# Patient Record
Sex: Female | Born: 1990 | Race: Black or African American | Hispanic: No
Health system: Southern US, Community
[De-identification: ages and names within clinical notes are randomized; demographics above are authoritative.]

## PROBLEM LIST (undated history)

## (undated) DIAGNOSIS — F419 Anxiety disorder, unspecified: Secondary | ICD-10-CM

## (undated) DIAGNOSIS — F32A Depression, unspecified: Secondary | ICD-10-CM

## (undated) DIAGNOSIS — F329 Major depressive disorder, single episode, unspecified: Secondary | ICD-10-CM

## (undated) DIAGNOSIS — Z9109 Other allergy status, other than to drugs and biological substances: Secondary | ICD-10-CM

## (undated) DIAGNOSIS — G43909 Migraine, unspecified, not intractable, without status migrainosus: Secondary | ICD-10-CM

---

## 2009-02-11 ENCOUNTER — Emergency Department (HOSPITAL_COMMUNITY): Admission: EM | Admit: 2009-02-11 | Discharge: 2009-02-11 | Payer: Self-pay | Admitting: Emergency Medicine

## 2010-11-01 ENCOUNTER — Emergency Department (HOSPITAL_COMMUNITY)
Admission: EM | Admit: 2010-11-01 | Discharge: 2010-11-01 | Disposition: A | Discharge status: Home / Self Care | Attending: Emergency Medicine | Admitting: Emergency Medicine

## 2010-11-01 DIAGNOSIS — R10819 Abdominal tenderness, unspecified site: Secondary | ICD-10-CM | POA: Insufficient documentation

## 2010-11-01 DIAGNOSIS — R319 Hematuria, unspecified: Secondary | ICD-10-CM | POA: Insufficient documentation

## 2010-11-01 DIAGNOSIS — R300 Dysuria: Secondary | ICD-10-CM | POA: Insufficient documentation

## 2010-11-01 LAB — URINE MICROSCOPIC-ADD ON

## 2010-11-01 LAB — URINALYSIS, ROUTINE W REFLEX MICROSCOPIC
Bilirubin Urine: NEGATIVE
Glucose, UA: NEGATIVE mg/dL
Hgb urine dipstick: NEGATIVE
Ketones, ur: NEGATIVE mg/dL
Nitrite: NEGATIVE
Protein, ur: NEGATIVE mg/dL
Specific Gravity, Urine: 1.011 (ref 1.005–1.030)
Urobilinogen, UA: 0.2 mg/dL (ref 0.0–1.0)
pH: 7 (ref 5.0–8.0)

## 2010-11-01 LAB — POCT PREGNANCY, URINE: Preg Test, Ur: NEGATIVE

## 2010-11-02 LAB — URINE CULTURE
Colony Count: NO GROWTH
Culture  Setup Time: 201203300144
Culture: NO GROWTH

## 2010-11-11 LAB — URINALYSIS, ROUTINE W REFLEX MICROSCOPIC
Bilirubin Urine: NEGATIVE
Protein, ur: NEGATIVE mg/dL
Urobilinogen, UA: 0.2 mg/dL (ref 0.0–1.0)

## 2010-11-11 LAB — URINE CULTURE: Colony Count: 25000

## 2010-11-11 LAB — URINE MICROSCOPIC-ADD ON

## 2010-11-11 LAB — POCT PREGNANCY, URINE: Preg Test, Ur: NEGATIVE

## 2011-07-03 ENCOUNTER — Emergency Department (INDEPENDENT_AMBULATORY_CARE_PROVIDER_SITE_OTHER)

## 2011-07-03 ENCOUNTER — Emergency Department (HOSPITAL_BASED_OUTPATIENT_CLINIC_OR_DEPARTMENT_OTHER)
Admission: EM | Admit: 2011-07-03 | Discharge: 2011-07-03 | Disposition: A | Discharge status: Home / Self Care | Attending: Emergency Medicine | Admitting: Emergency Medicine

## 2011-07-03 DIAGNOSIS — M549 Dorsalgia, unspecified: Secondary | ICD-10-CM

## 2011-07-03 DIAGNOSIS — Z79899 Other long term (current) drug therapy: Secondary | ICD-10-CM | POA: Insufficient documentation

## 2011-07-03 DIAGNOSIS — M542 Cervicalgia: Secondary | ICD-10-CM

## 2011-07-03 DIAGNOSIS — Y92410 Unspecified street and highway as the place of occurrence of the external cause: Secondary | ICD-10-CM | POA: Insufficient documentation

## 2011-07-03 DIAGNOSIS — Q765 Cervical rib: Secondary | ICD-10-CM

## 2011-07-03 DIAGNOSIS — S239XXA Sprain of unspecified parts of thorax, initial encounter: Secondary | ICD-10-CM

## 2011-07-03 HISTORY — DX: Anxiety disorder, unspecified: F41.9

## 2011-07-03 HISTORY — DX: Other allergy status, other than to drugs and biological substances: Z91.09

## 2011-07-03 HISTORY — DX: Migraine, unspecified, not intractable, without status migrainosus: G43.909

## 2011-07-03 MED ORDER — ONDANSETRON 4 MG PO TBDP
ORAL_TABLET | ORAL | Status: AC
  Administered 2011-07-03: 4 mg via ORAL
  Filled 2011-07-03: qty 1

## 2011-07-03 MED ORDER — HYDROCODONE-ACETAMINOPHEN 5-325 MG PO TABS: 2.0000 | ORAL_TABLET | ORAL | Status: AC | PRN

## 2011-07-03 MED ORDER — IBUPROFEN 800 MG PO TABS: 800.0000 mg | ORAL_TABLET | Freq: Three times a day (TID) | ORAL | Status: AC

## 2011-07-03 MED ORDER — ONDANSETRON 4 MG PO TBDP
4.0000 mg | ORAL_TABLET | Freq: Once | ORAL | Status: AC
  Administered 2011-07-03: 4 mg via ORAL

## 2011-07-03 NOTE — ED Notes (Signed)
Pt states that she is now feeling nauseous, will give zofran per protocol

## 2011-07-03 NOTE — ED Notes (Signed)
Provider at bedside to remove LSB, orders given for C-Spine and T-Spine Studies
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
## 2011-07-16 ENCOUNTER — Ambulatory Visit: Payer: No Typology Code available for payment source | Admitting: Family Medicine
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
## 2012-10-21 ENCOUNTER — Other Ambulatory Visit: Payer: Self-pay | Admitting: Physician Assistant

## 2012-10-21 ENCOUNTER — Other Ambulatory Visit (HOSPITAL_COMMUNITY)
Admission: RE | Admit: 2012-10-21 | Discharge: 2012-10-21 | Disposition: A | Discharge status: Home / Self Care | Payer: BC Managed Care – PPO | Source: Ambulatory Visit | Attending: Family Medicine | Admitting: Family Medicine

## 2012-10-21 DIAGNOSIS — Z01419 Encounter for gynecological examination (general) (routine) without abnormal findings: Secondary | ICD-10-CM | POA: Insufficient documentation

## 2012-10-21 DIAGNOSIS — Z113 Encounter for screening for infections with a predominantly sexual mode of transmission: Secondary | ICD-10-CM | POA: Insufficient documentation

## 2012-11-12 ENCOUNTER — Ambulatory Visit: Payer: BC Managed Care – PPO | Admitting: Diagnostic Neuroimaging

## 2013-05-11 ENCOUNTER — Ambulatory Visit (HOSPITAL_COMMUNITY)
Admission: RE | Admit: 2013-05-11 | Discharge: 2013-05-11 | Disposition: A | Discharge status: Home / Self Care | Payer: BC Managed Care – PPO | Attending: Psychiatry | Admitting: Psychiatry

## 2013-05-11 HISTORY — DX: Depression, unspecified: F32.A

## 2013-05-11 HISTORY — DX: Major depressive disorder, single episode, unspecified: F32.9

## 2013-05-12 ENCOUNTER — Other Ambulatory Visit: Payer: Self-pay | Admitting: Emergency Medicine

## 2013-05-12 ENCOUNTER — Encounter (HOSPITAL_COMMUNITY): Payer: Self-pay | Admitting: Registered Nurse

## 2013-05-12 ENCOUNTER — Encounter (HOSPITAL_COMMUNITY): Payer: Self-pay | Admitting: *Deleted

## 2013-05-12 ENCOUNTER — Emergency Department (HOSPITAL_COMMUNITY)
Admission: EM | Admit: 2013-05-12 | Discharge: 2013-05-13 | Disposition: A | Discharge status: Other Acute Inpatient Hospital | Payer: BC Managed Care – PPO | Attending: Emergency Medicine | Admitting: Emergency Medicine

## 2013-05-12 DIAGNOSIS — Z3202 Encounter for pregnancy test, result negative: Secondary | ICD-10-CM | POA: Insufficient documentation

## 2013-05-12 DIAGNOSIS — Z79899 Other long term (current) drug therapy: Secondary | ICD-10-CM | POA: Insufficient documentation

## 2013-05-12 DIAGNOSIS — F329 Major depressive disorder, single episode, unspecified: Secondary | ICD-10-CM

## 2013-05-12 DIAGNOSIS — F332 Major depressive disorder, recurrent severe without psychotic features: Secondary | ICD-10-CM

## 2013-05-12 DIAGNOSIS — R45851 Suicidal ideations: Secondary | ICD-10-CM | POA: Insufficient documentation

## 2013-05-12 DIAGNOSIS — F411 Generalized anxiety disorder: Secondary | ICD-10-CM | POA: Insufficient documentation

## 2013-05-12 DIAGNOSIS — F3289 Other specified depressive episodes: Secondary | ICD-10-CM | POA: Insufficient documentation

## 2013-05-12 DIAGNOSIS — Z0289 Encounter for other administrative examinations: Secondary | ICD-10-CM | POA: Insufficient documentation

## 2013-05-12 LAB — COMPREHENSIVE METABOLIC PANEL
ALT: 19 U/L (ref 0–35)
Alkaline Phosphatase: 57 U/L (ref 39–117)
BUN: 8 mg/dL (ref 6–23)
CO2: 20 mEq/L (ref 19–32)
GFR calc Af Amer: 80 mL/min — ABNORMAL LOW (ref >90)
GFR calc non Af Amer: 69 mL/min — ABNORMAL LOW (ref >90)
Glucose, Bld: 89 mg/dL (ref 70–99)
Potassium: 3.5 mEq/L (ref 3.5–5.1)
Sodium: 137 mEq/L (ref 135–145)
Total Bilirubin: 0.2 mg/dL — ABNORMAL LOW (ref 0.3–1.2)
Total Protein: 7.5 g/dL (ref 6.0–8.3)

## 2013-05-12 LAB — CBC WITH DIFFERENTIAL/PLATELET
Basophils Relative: 1 % (ref 0–1)
Eosinophils Absolute: 0.9 10*3/uL — ABNORMAL HIGH (ref 0.0–0.7)
Eosinophils Relative: 12 % — ABNORMAL HIGH (ref 0–5)
HCT: 39.8 % (ref 36.0–46.0)
Hemoglobin: 13.5 g/dL (ref 12.0–15.0)
Lymphocytes Relative: 52 % — ABNORMAL HIGH (ref 12–46)
MCH: 24 pg — ABNORMAL LOW (ref 26.0–34.0)
MCHC: 33.9 g/dL (ref 30.0–36.0)
Neutro Abs: 2.1 10*3/uL (ref 1.7–7.7)
RBC: 5.62 MIL/uL — ABNORMAL HIGH (ref 3.87–5.11)

## 2013-05-12 LAB — RAPID URINE DRUG SCREEN, HOSP PERFORMED: Opiates: NOT DETECTED

## 2013-05-12 LAB — PREGNANCY, URINE: Preg Test, Ur: NEGATIVE

## 2013-05-12 LAB — ETHANOL: Alcohol, Ethyl (B): <11 mg/dL (ref 0–11)

## 2013-05-12 MED ORDER — ADULT MULTIVITAMIN W/MINERALS CH
1.0000 | ORAL_TABLET | Freq: Every day | ORAL | Status: DC
  Administered 2013-05-12 – 2013-05-13 (×2): 1 via ORAL
  Filled 2013-05-12: qty 1

## 2013-05-12 MED ORDER — TOPIRAMATE 25 MG PO TABS
50.0000 mg | ORAL_TABLET | Freq: Two times a day (BID) | ORAL | Status: DC
  Administered 2013-05-12 – 2013-05-13 (×2): 50 mg via ORAL
  Filled 2013-05-12 (×2): qty 2

## 2013-05-12 MED ORDER — ALUM & MAG HYDROXIDE-SIMETH 200-200-20 MG/5ML PO SUSP: 30.0000 mL | ORAL | Status: DC | PRN

## 2013-05-12 MED ORDER — NICOTINE 21 MG/24HR TD PT24: 21.0000 mg | MEDICATED_PATCH | Freq: Every day | TRANSDERMAL | Status: DC

## 2013-05-12 MED ORDER — FLUOXETINE HCL 20 MG PO CAPS
40.0000 mg | ORAL_CAPSULE | Freq: Every day | ORAL | Status: DC
  Administered 2013-05-12 – 2013-05-13 (×2): 40 mg via ORAL
  Filled 2013-05-12 (×2): qty 2

## 2013-05-12 MED ORDER — ZOLPIDEM TARTRATE 5 MG PO TABS: 5.0000 mg | ORAL_TABLET | Freq: Every evening | ORAL | Status: DC | PRN

## 2013-05-12 MED ORDER — LORAZEPAM 1 MG PO TABS
1.0000 mg | ORAL_TABLET | Freq: Three times a day (TID) | ORAL | Status: DC | PRN
  Administered 2013-05-12 (×2): 1 mg via ORAL
  Filled 2013-05-12 (×2): qty 1

## 2013-05-12 MED ORDER — LORATADINE 10 MG PO TABS
10.0000 mg | ORAL_TABLET | Freq: Every day | ORAL | Status: DC
  Administered 2013-05-12 – 2013-05-13 (×2): 10 mg via ORAL
  Filled 2013-05-12 (×3): qty 1

## 2013-05-12 MED ORDER — ONDANSETRON HCL 4 MG PO TABS: 4.0000 mg | ORAL_TABLET | Freq: Three times a day (TID) | ORAL | Status: DC | PRN

## 2013-05-12 MED ORDER — IBUPROFEN 200 MG PO TABS
600.0000 mg | ORAL_TABLET | Freq: Three times a day (TID) | ORAL | Status: DC | PRN
  Administered 2013-05-12: 600 mg via ORAL
  Filled 2013-05-12: qty 3

## 2013-05-12 NOTE — ED Notes (Signed)
Previous charting completed on downtime forms.

## 2013-05-12 NOTE — ED Notes (Signed)
Pt on phone with boyfriend, became upset. Tearful, breathing heavy. States that she need something for anxiety.

## 2013-05-12 NOTE — ED Provider Notes (Signed)
CSN: [id number]     Arrival date & time 05/12/13  0029 History   First MD Initiated Contact with Patient 05/12/13 0228     Chief Complaint  Patient presents with  . Depression   HPI  History provided by the patient. Patient is a 22 year old female with history of anxiety and depression who presents for medical clearance with worsening depression. Patient is with her sister and had gone to [facility] for help with worsening depression. Patient currently treated with Prozac but states this has not made any significant change in her depression over the past year. She feels that her life is hopeless and does report occasional thoughts of suicide. She denies active thoughts of suicide or any plans. Denies any HI. She was sent from [facility] for further medical screening. At this time they do not have a bed placement but she has been accepted and is available. She denies any other aggravating or alleviating factors. No other associated symptoms.     Past Medical History  Diagnosis Date  . Migraines   . Environmental allergies   . Anxiety   . Depression    No past surgical history on file. No family history on file. History  Substance Use Topics  . Smoking status: Never Smoker   . Smokeless tobacco: Never Used  . Alcohol Use: No   OB History   Grav Para Term Preterm Abortions TAB SAB Ect Mult Living                 Review of Systems  Psychiatric/Behavioral: Positive for dysphoric mood.  All other systems reviewed and are negative.    Allergies  Sulfa antibiotics  Home Medications   Current Outpatient Rx  Name  Route  Sig  Dispense  Refill  . B Complex-C (SUPER B COMPLEX) TABS   Oral   Take 1 tablet by mouth daily.           . Biotin 2500 MCG CAPS   Oral   Take 1 capsule by mouth daily.           . cetirizine (ZYRTEC) 10 MG tablet   Oral   Take 10 mg by mouth daily.           . cholecalciferol (VITAMIN D) 1000 UNITS tablet   Oral   Take 1,000 Units by mouth daily.           . Deanol Bitartrate (DMAE BITARTRATE) POWD   Oral   Take by mouth daily.           . drospirenone-ethinyl estradiol (YAZ,GIANVI,LORYNA) 3-0.02 MG tablet   Oral   Take 1 tablet by mouth daily.           . Ginkgo Biloba (GINKOBA) 40 MG TABS   Oral   Take 1 tablet by mouth daily.           . montelukast (SINGULAIR) 10 MG tablet   Oral   Take 10 mg by mouth at bedtime.           . Multiple Vitamin (MULTIVITAMIN) tablet   Oral   Take 1 tablet by mouth daily.           . rizatriptan (MAXALT) 10 MG tablet   Oral   Take 10 mg by mouth as needed. For migraines. May repeat in 2 hours if needed          . Safflower Oil (CLA) 1000 MG CAPS   Oral   Take 1 capsule by  mouth daily.           . Topiramate (TOPAMAX PO)   Oral   Take 2 tablets by mouth at bedtime.           . vitamin C (ASCORBIC ACID) 500 MG tablet   Oral   Take 500 mg by mouth daily.           . vitamin E 400 UNIT capsule   Oral   Take 400 Units by mouth daily.            There were no vitals taken for this visit. Physical Exam  Nursing note and vitals reviewed. Constitutional: She is oriented to person, place, and time. She appears well-developed and well-nourished. No distress.  HENT:  Head: Normocephalic and atraumatic.  Eyes: Conjunctivae are normal.  Cardiovascular: Normal rate and regular rhythm.   Pulmonary/Chest: Effort normal and breath sounds normal. No respiratory distress. She has no wheezes. She has no rales.  Abdominal: Soft.  Musculoskeletal: Normal range of motion.  Neurological: She is alert and oriented to person, place, and time.  Skin: Skin is warm and dry. No rash noted.  Psychiatric: Her behavior is normal. She is not actively hallucinating. She exhibits a depressed mood. She expresses no homicidal and no suicidal ideation.    ED Course  Procedures   COORDINATION OF CARE:  Nursing notes reviewed. Vital signs reviewed. Initial pt interview and examination  performed.  Discussed treatment plan with pt at bedside, which includes medical evaluation followed by psychiatric evaluation . Pt agrees with plan.  Initial diagnostic testing ordered.    Psych holding orders in place.   Results for orders placed in visit on 05/12/13  PREGNANCY, URINE      Result Value Range   Preg Test, Ur NEGATIVE  NEGATIVE   CBC WBC 7.3, RBC 5.62, hemoglobin 13.5, hematocrit 39.8, platelets 276  Drug Screen Positive for tetrahydrocannabinol  CMP Sodium 137, potassium 3.5, chloride 105, carbon dioxide 20, glucose 89, BUN 8, creatinine 1.11, calcium 9.2, total protein 7.5, albumin 3.7, AST 20, ALT 19, alkaline phosphatase 57, total bilirubin 0.2  EtOH level <11  Acetaminophen level <15  MDM   1. Depression        Angus Seller, PA-C 05/12/13 0422

## 2013-05-12 NOTE — BHH Counselor (Signed)
This Clinical research associate discussed disposition with Marquita Palms M(AC) and Jorje Guild, PA; pt has been accepted pending an avail bed.  Pt sent to [facility] for medical clearance and holding.

## 2013-05-12 NOTE — ED Provider Notes (Signed)
Medical screening examination/treatment/procedure(s) were conducted as a shared visit with non-physician practitioner(s) and myself.  I personally evaluated the patient during the encounter  Patient will be seen and evaluated by the psychiatry service and evaluated  Lyanne Co, MD 05/12/13 0430

## 2013-05-12 NOTE — Consult Note (Signed)
[facility] Face-to-Face Psychiatry Consult   Reason for Consult:  Evaluation for inpatient treatment Major depressive disorder Referring Physician:  EDP  Caroline Bolton is an 22 y.o. female.  Assessment: AXIS I:  Major Depression, Recurrent severe AXIS II:  Deferred AXIS III:   Past Medical History  Diagnosis Date  . Migraines   . Environmental allergies   . Anxiety   . Depression    AXIS IV:  other psychosocial or environmental problems and problems with access to health care services AXIS V:  11-20 some danger of hurting self or others possible OR occasionally fails to maintain minimal personal hygiene OR gross impairment in communication  Plan:  Recommend psychiatric Inpatient admission when medically cleared.  Subjective:   Caroline Bolton is a 22 y.o. female.  HPI:  Patient states that she has had depression for a while and it has gradually gotten worse.  States that she has not states to feel that she doesn't want to be here.  "I found my self unable to sleep and crying.  Last night I thought I didn't want to be her; I didn't necessarily have a plan or anything just bad thoughts. In the past I have taken overdose of sleeping pills; but did not need hospitalization. They just made me sleepy."  Patient states that her PCP had put her on Prozac once in the past "I was like WOW cause I started to feel better; but then I started to feel low and could pick my self back up.   My PCP increased the dosage to 40 mg but it did not help.  I am still having the suicidal thoughts, no plan; but I know I have the ability to do it and I find myself staring at pills.  I don't know how I get up and into the bathroom; its just the next thing I know I am standing in front of my pills staring at them." HPI Elements:   Location:  [facility] ED. Quality:  Affecting patient mentally and physically. Severity:  suicidal thoughts.  Past Psychiatric History: Past Medical History  Diagnosis Date  . Migraines    . Environmental allergies   . Anxiety   . Depression     reports that she has never smoked. She has never used smokeless tobacco. She reports that she does not drink alcohol or use illicit drugs. No family history on file.         Allergies:   Allergies  Allergen Reactions  . Sulfa Antibiotics Anaphylaxis, Hives and Swelling    ACT Assessment Complete:  Yes:    Educational Status    Risk to Self:  Suicidal thoughts  Risk to Others:  NO  Abuse:  No  Prior Inpatient Therapy:  No  Prior Outpatient Therapy:  No  Additional Information:      Objective: Blood pressure 158/88, pulse 119, temperature 98.5 F (36.9 C), temperature source Oral, resp. rate 18, SpO2 100.00%.There is no weight on file to calculate BMI. Results for orders placed in visit on 05/12/13 (from the past 72 hour(s))  PREGNANCY, URINE     Status: None   Collection Time    05/12/13 12:45 AM      Result Value Range   Preg Test, Ur NEGATIVE  NEGATIVE   Comment:            THE SENSITIVITY OF THIS     METHODOLOGY IS >20 mIU/mL.    Current Facility-Administered Medications  Medication Dose Route Frequency Provider  Last Rate Last Dose  . alum & mag hydroxide-simeth (MAALOX/MYLANTA) 200-200-20 MG/5ML suspension 30 mL  30 mL Oral PRN Phill Mutter Dammen, PA-C      . ibuprofen (ADVIL,MOTRIN) tablet 600 mg  600 mg Oral Q8H PRN Angus Seller, PA-C      . LORazepam (ATIVAN) tablet 1 mg  1 mg Oral Q8H PRN Angus Seller, PA-C   1 mg at 05/12/13 1219  . nicotine (NICODERM CQ - dosed in mg/24 hours) patch 21 mg  21 mg Transdermal Daily Phill Mutter Dammen, PA-C      . ondansetron [facility]) tablet 4 mg  4 mg Oral Q8H PRN Phill Mutter Dammen, PA-C      . zolpidem (AMBIEN) tablet 5 mg  5 mg Oral QHS PRN Angus Seller, PA-C       Current Outpatient Prescriptions  Medication Sig Dispense Refill  . B Complex-C (SUPER B COMPLEX) TABS Take 1 tablet by mouth daily.       . Biotin 2500 MCG CAPS Take 1 capsule by mouth daily.       .  cetirizine (ZYRTEC) 10 MG tablet Take 10 mg by mouth daily as needed for allergies.       . cholecalciferol (VITAMIN D) 1000 UNITS tablet Take 1,000 Units by mouth daily.        . Deanol Bitartrate (DMAE BITARTRATE) POWD Take by mouth daily.       . montelukast (SINGULAIR) 10 MG tablet Take 10 mg by mouth at bedtime.        . Multiple Vitamin (MULTIVITAMIN) tablet Take 1 tablet by mouth daily.       . rizatriptan (MAXALT) 10 MG tablet Take 10 mg by mouth as needed for migraine. For migraines. May repeat in 2 hours if needed      . Safflower Oil (CLA) 1000 MG CAPS Take 1 capsule by mouth daily.       Marland Kitchen topiramate (TOPAMAX) 50 MG tablet Take 50 mg by mouth 2 (two) times daily.      . vitamin E 400 UNIT capsule Take 400 Units by mouth daily.         Psychiatric Specialty Exam:     Blood pressure 158/88, pulse 119, temperature 98.5 F (36.9 C), temperature source Oral, resp. rate 18, SpO2 100.00%.There is no weight on file to calculate BMI.  General Appearance: Casual  Eye Contact::  Good  Speech:  Clear and Coherent and Normal Rate  Volume:  Normal  Mood:  Depressed and Hopeless  Affect:  Depressed and Flat  Thought Process:  Circumstantial and Goal Directed  Orientation:  Full (Time, Place, and Person)  Thought Content:  Rumination  Suicidal Thoughts:  Yes.  with intent/plan  Homicidal Thoughts:  No  Memory:  Immediate;   Good Recent;   Good Remote;   Good  Judgement:  Impaired  Insight:  Fair  Psychomotor Activity:  Normal  Concentration:  Fair  Recall:  Good  Akathisia:  No  Handed:  Right  AIMS (if indicated):     Assets:  Communication Skills Desire for Improvement Housing Social Support Transportation  Sleep:      Face to face interview and consult with Dr. Lolly Mustache  Treatment Plan Summary: Daily contact with patient to assess and evaluate symptoms and progress in treatment Medication management  Disposition: Inpatient treatment; Patient accepted to [facility]  pending bed availability.  If no bed available seek placement elsewhere 1. Admit for crisis management and  stabilization.  2. Review and initiate  medications pertinent to patient illness and treatment.  3. Medication management to reduce current symptoms to base line and improve the         patient's overall level of functioning.   Restart Prozac 40 mg daily and other home medications  Rankin, Shuvon, FNP-BC 05/12/2013 3:29 PM  I have personally seen the patient and agreed with the findings and involved in the treatment plan. Kathryne Sharper, MD

## 2013-05-12 NOTE — BH Assessment (Signed)
Assessment Note  Caroline Bolton is a 22 y.o. female who presented to [facility] accompanied by sister with c/o depression/SI/anxiety.  Pt denies HI/AVH.  Pt told this writer that she has been feeling SI x2 wks--I don't have a plan but I have the ability to hurt myself".  Pt is a Archivist at [employer] and states she has stressors: (1) Family pressuring her to get a job, parents are supporting her education--"I should've already graduated", (2) School, (3) Financial,(4) Relational issues with boyfriend; mother is living with them.  Pt is tearful and anxious while she interviews with this Clinical research associate and says she was encouraged to seek help with [facility] by her therapist, who told her she may need to check into the hospital.  Pt says she has negative thoughts and feels out of controls and overwhelmed.    Pt has no past admissions but admits to trying to harm herself 4-5x's by overdosing on pills.  Pt says she is prescribed prozac and anxiety med( can't remember) and feels the medications are no longer working for her.  Pt has a hx of bulimia and says she's not had any problem with it, however within the last month she's been "binging and purging".  Pt describes depressive sxs; isolates self from family/friends, daily crying spells, and sleeping more than 24hrs, doesn't enjoy activities any longer.  Axis I: Major Depression, Recurrent severe and Bulimia Nervosa 307.51 Axis II: Deferred Axis III:  Past Medical History  Diagnosis Date  . Migraines   . Environmental allergies   . Anxiety   . Depression    Axis IV: other psychosocial or environmental problems, problems related to social environment and problems with primary support group Axis V: 31-40 impairment in reality testing  Past Medical History:  Past Medical History  Diagnosis Date  . Migraines   . Environmental allergies   . Anxiety   . Depression     No past surgical history on file.  Family History: No family history on file.  Social History:   reports that she has never smoked. She has never used smokeless tobacco. She reports that she does not drink alcohol or use illicit drugs.  Additional Social History:     CIWA:   COWS:    Allergies:  Allergies  Allergen Reactions  . Sulfa Antibiotics Anaphylaxis, Hives and Swelling    Home Medications:  (Not in a hospital admission)  OB/GYN Status:  No LMP recorded.                                                               Disposition:     On Site Evaluation by:   Reviewed with Physician:    Murrell Redden 05/12/2013 4:16 AM

## 2013-05-12 NOTE — Progress Notes (Signed)
Female (contact number [phone] area code) called to speak with pt.  She is awake and at [facility] nursing station Call transferred to ED mobile for pt to receive.  Pt tearful, sniffing while speaking with female "Roque Lias ok"

## 2013-05-12 NOTE — BHH Counselor (Signed)
Patient has been referred to the following facilities:  [facility]  [facility]  [facility]  [facility]  [facility]  [facility]  [facility]  

## 2013-05-13 ENCOUNTER — Encounter (HOSPITAL_COMMUNITY): Payer: Self-pay | Admitting: *Deleted

## 2013-05-13 ENCOUNTER — Inpatient Hospital Stay (HOSPITAL_COMMUNITY)
Admission: AD | Admit: 2013-05-13 | Discharge: 2013-05-19 | DRG: 430 | Disposition: A | Discharge status: Home / Self Care | Payer: BC Managed Care – PPO | Source: Intra-hospital | Attending: Psychiatry | Admitting: Psychiatry

## 2013-05-13 DIAGNOSIS — R45851 Suicidal ideations: Secondary | ICD-10-CM

## 2013-05-13 DIAGNOSIS — F502 Bulimia nervosa, unspecified: Secondary | ICD-10-CM | POA: Diagnosis present

## 2013-05-13 DIAGNOSIS — F411 Generalized anxiety disorder: Secondary | ICD-10-CM | POA: Diagnosis present

## 2013-05-13 DIAGNOSIS — F332 Major depressive disorder, recurrent severe without psychotic features: Principal | ICD-10-CM | POA: Diagnosis present

## 2013-05-13 DIAGNOSIS — Z79899 Other long term (current) drug therapy: Secondary | ICD-10-CM

## 2013-05-13 DIAGNOSIS — F329 Major depressive disorder, single episode, unspecified: Secondary | ICD-10-CM | POA: Diagnosis present

## 2013-05-13 MED ORDER — ADULT MULTIVITAMIN W/MINERALS CH
1.0000 | ORAL_TABLET | Freq: Every day | ORAL | Status: DC
  Administered 2013-05-13 – 2013-05-19 (×7): 1 via ORAL
  Filled 2013-05-13 (×11): qty 1

## 2013-05-13 MED ORDER — LORATADINE 10 MG PO TABS
10.0000 mg | ORAL_TABLET | Freq: Every day | ORAL | Status: DC
  Administered 2013-05-13 – 2013-05-19 (×7): 10 mg via ORAL
  Filled 2013-05-13 (×11): qty 1

## 2013-05-13 MED ORDER — ACETAMINOPHEN 325 MG PO TABS
650.0000 mg | ORAL_TABLET | Freq: Four times a day (QID) | ORAL | Status: DC | PRN
Start: 2013-05-13 — End: 2013-05-19
  Administered 2013-05-13 – 2013-05-16 (×4): 650 mg via ORAL
  Filled 2013-05-13 (×3): qty 2

## 2013-05-13 MED ORDER — HYDROXYZINE HCL 25 MG PO TABS: 25.0000 mg | ORAL_TABLET | Freq: Every evening | ORAL | Status: DC | PRN

## 2013-05-13 MED ORDER — INFLUENZA VAC SPLIT QUAD 0.5 ML IM SUSP
0.5000 mL | INTRAMUSCULAR | Status: AC
  Administered 2013-05-14: 0.5 mL via INTRAMUSCULAR
  Filled 2013-05-13: qty 0.5

## 2013-05-13 MED ORDER — TOPIRAMATE 25 MG PO TABS
50.0000 mg | ORAL_TABLET | Freq: Two times a day (BID) | ORAL | Status: DC
  Administered 2013-05-13 – 2013-05-17 (×9): 50 mg via ORAL
  Filled 2013-05-13 (×14): qty 2

## 2013-05-13 MED ORDER — MAGNESIUM HYDROXIDE 400 MG/5ML PO SUSP: 30.0000 mL | Freq: Every day | ORAL | Status: DC | PRN

## 2013-05-13 MED ORDER — FLUOXETINE HCL 20 MG PO CAPS
40.0000 mg | ORAL_CAPSULE | Freq: Every day | ORAL | Status: DC
  Administered 2013-05-13 – 2013-05-14 (×2): 40 mg via ORAL
  Filled 2013-05-13 (×5): qty 2

## 2013-05-13 MED ORDER — ALUM & MAG HYDROXIDE-SIMETH 200-200-20 MG/5ML PO SUSP: 30.0000 mL | ORAL | Status: DC | PRN

## 2013-05-13 NOTE — ED Notes (Signed)
Per pt she is having anxiety and difficulty with school and her boyfriend. Pt denies any HI or SI. Pt states that she has hx of depression and had to leave school this semester d/t her depression and anxiety.

## 2013-05-13 NOTE — Consult Note (Signed)
  Psychiatric Specialty Exam: Physical Exam  ROS  Blood pressure 119/65, pulse 77, temperature 98.4 F (36.9 C), temperature source Oral, resp. rate 16, SpO2 100.00%.There is no weight on file to calculate BMI.  General Appearance: Fairly Groomed  Patent attorney::  Good  Speech:  Clear and Coherent  Volume:  Normal  Mood:  Depressed  Affect:  Appropriate  Thought Process:  Logical  Orientation:  Full (Time, Place, and Person)  Thought Content:  Negative  Suicidal Thoughts:  Yes.  without intent/plan  Homicidal Thoughts:  No  Memory:  Immediate;   Good Recent;   Good Remote;   Good  Judgement:  Intact  Insight:  Fair  Psychomotor Activity:  Normal  Concentration:  Good  Recall:  Good  Akathisia:  Negative  Handed:  Right  AIMS (if indicated):     Assets:  Communication Skills Desire for Improvement  Sleep:      Caroline Bolton remains suicidal today.  She will be admitted to [facility] for further treatment.

## 2013-05-13 NOTE — ED Notes (Signed)
Pt reports lot of stress with school, sts she feels overwhelmed and needs help with learning "how to cope with life". Pt is very pleasant, calm.

## 2013-05-13 NOTE — Progress Notes (Signed)
Patient ID: Caroline Bolton, female   DOB: November 20, 1990, 22 y.o.   MRN: [id number] D:  Patient admitted today from [facility].  States she was feeling overwhelmed and came to the hospital on the advice of her therapist.  Came to [facility] but there were no beds so she went to [facility].  She is a 22 year old [place] American woman who has recently been a Consulting civil engineer at [employer].  She lives in [place] with her boyfriend who is very supportive.  She has a strained relationship with her family, especially her mother.  States mother does not understand mental illness at all and believes people should just smile and hide all of their problems.  States she feels like a failure in her families eyes for taking a semester off of school.  She was cooperative yet tearful during the admission process.  She also states she has a history of binging and purging and has started doing that again recently, but not every day.   A:  Patient oriented to the unit, to her room, and to the unit schedule.  She was given red socks and a yellow arm band as she has had a fall in the past six months and describes herself as "clumsy."  All paperwork signed.  Patient offered food and fluids.   R:  Pleasant and cooperative with the admission process.  Denies suicidal ideation at this time, but agrees to come to staff if she does have thoughts of suicide or self harm.  Verbalizes understanding of all education done.

## 2013-05-13 NOTE — Progress Notes (Signed)
D   Pt is depressed and anxious  She reports she needs something for anxiety that the medications she is currently taking are not working and she is afraid she will have a panic attack is she doesn't get something   She said the vistaril works ok in that it causes her to go to sleep but doesn't really stop the anxiety   A   Verbal support given  Medications administered and effectiveness monitored   Q 15 min checks  Discussed medications and other ways to relieve anxiety R   Pt safe at present and verbalized understanding of instructions

## 2013-05-13 NOTE — Progress Notes (Signed)
[facility] did not get to see patient but will be sending information about the [facility] Orange Card program, using the address provided.  °

## 2013-05-13 NOTE — ED Notes (Addendum)
Pt accepted to [facility], report given to [place] RN, called [place] transportation per dispatcher their driver will be here within an hour.

## 2013-05-13 NOTE — BH Assessment (Signed)
Per Tanna Savoy at [facility], pt has been accepted to [facility]. Pt can be transported asap. Writer notified Dr. Ladona Ridgel & pt's RN. Support paperwork signed and faxed to [facility]. Originals placed in pt's chart.  Evette Cristal, [place] Assessment Counselor

## 2013-05-13 NOTE — Tx Team (Signed)
Initial Interdisciplinary Treatment Plan  PATIENT STRENGTHS: (choose at least two) Ability for insight Active sense of humor Average or above average intelligence Capable of independent living Communication skills Financial means General fund of knowledge Motivation for treatment/growth Physical Health Religious Affiliation Supportive family/friends  PATIENT STRESSORS: Marital or family conflict Substance abuse   PROBLEM LIST: Problem List/Patient Goals Date to be addressed Date deferred Reason deferred Estimated date of resolution  Decrease in depressive and anxiety symptoms.  13 May 2013     Improved communication skills for talking with mother. 13 May 2013                                                DISCHARGE CRITERIA:  Ability to meet basic life and health needs Adequate post-discharge living arrangements Improved stabilization in mood, thinking, and/or behavior Motivation to continue treatment in a less acute level of care  PRELIMINARY DISCHARGE PLAN: Attend aftercare/continuing care group Outpatient therapy Return to previous living arrangement  PATIENT/FAMIILY INVOLVEMENT: This treatment plan has been presented to and reviewed with the patient, Caroline Bolton, and/or family member.  The patient and family have been given the opportunity to ask questions and make suggestions.  Caroline Bolton 05/13/2013, 7:21 PM

## 2013-05-13 NOTE — Progress Notes (Signed)
Recreation Therapy Notes  Date: 10.09.2014 Time: 2:45pm Location: [address] Dayroom  Group Topic: Animal Assisted Activities (AAA)  Behavioral Response: Did not attend   Avry Roedl L Sydnie Sigmund, LRT/CTRS  Eldra Word L 05/13/2013 4:42 PM 

## 2013-05-13 NOTE — Progress Notes (Signed)
Adult Psychoeducational Group Note  Date:  05/13/2013 Time:  11:00am Group Topic/Focus:  Building Self Esteem:   The Focus of this group is helping patients become aware of the effects of self-esteem on their lives, the things they and others do that enhance or undermine their self-esteem, seeing the relationship between their level of self-esteem and the choices they make and learning ways to enhance self-esteem.  Participation Level:  Active  Participation Quality:  Appropriate and Attentive  Affect:  Appropriate  Cognitive:  Alert and Appropriate  Insight: Appropriate  Engagement in Group:  Engaged  Modes of Intervention:  Discussion and Education  Additional Comments:  Pt attended and participated in group. Discussion was about lifestyle changes. When ask what she would change about her life pt stated have an open relationship with her mother.   Shelly Bombard D 05/13/2013, 3:02 PM
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
## 2013-05-13 NOTE — Progress Notes (Signed)
D: Patient appropriate and cooperative with staff. Patient's affect/mood is anxious; very tearful at times. She's interactive with peers in the milieu. Attending meals. Patient verbalized that she's very overwhelmed with school at this time and not sure how to cope with this issue.   A: Support and encouragement provided to patient. Monitor Q15 minute checks for safety.  R: Patient receptive. Denies SI/HI. Patient remains safe on the unit.

## 2013-05-14 DIAGNOSIS — R45851 Suicidal ideations: Secondary | ICD-10-CM

## 2013-05-14 DIAGNOSIS — F502 Bulimia nervosa: Secondary | ICD-10-CM | POA: Diagnosis present

## 2013-05-14 MED ORDER — BUPROPION HCL ER (SR) 100 MG PO TB12
100.0000 mg | ORAL_TABLET | Freq: Every day | ORAL | Status: DC
  Administered 2013-05-14 – 2013-05-18 (×5): 100 mg via ORAL
  Filled 2013-05-14 (×7): qty 1

## 2013-05-14 MED ORDER — HYDROXYZINE HCL 50 MG PO TABS
50.0000 mg | ORAL_TABLET | Freq: Every evening | ORAL | Status: DC | PRN
  Administered 2013-05-14 – 2013-05-17 (×7): 50 mg via ORAL
  Filled 2013-05-14 (×7): qty 1

## 2013-05-14 MED ORDER — BUSPIRONE HCL 15 MG PO TABS
7.5000 mg | ORAL_TABLET | Freq: Two times a day (BID) | ORAL | Status: DC
  Administered 2013-05-14 – 2013-05-15 (×4): 7.5 mg via ORAL
  Administered 2013-05-16: 17:00:00 via ORAL
  Administered 2013-05-16 – 2013-05-18 (×4): 7.5 mg via ORAL
  Filled 2013-05-14 (×12): qty 1

## 2013-05-14 NOTE — [facility] Group Notes (Signed)
[facility] LCSW Aftercare Discharge Planning Group Note   05/14/2013 12:34 PM  Participation Quality:   Did not attend due to meeting with MD.   Wynn Banker
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
## 2013-05-14 NOTE — [facility] Suicide Risk Assessment (Signed)
Suicide Risk Assessment  Admission Assessment     Nursing information obtained from:  Patient Demographic factors:  Adolescent or young adult Current Mental Status:  NA Loss Factors:  NA Historical Factors:  NA Risk Reduction Factors:  Sense of responsibility to family;Religious beliefs about death;Living with another person, especially a relative;Positive social support;Positive therapeutic relationship;Positive coping skills or problem solving skills  CLINICAL FACTORS:   Severe Anxiety and/or Agitation Depression:   Anhedonia Hopelessness Impulsivity Insomnia Recent sense of peace/wellbeing Severe Alcohol/Substance Abuse/Dependencies Personality Disorders:   Cluster B Unstable or Poor Therapeutic Relationship Previous Psychiatric Diagnoses and Treatments Medical Diagnoses and Treatments/Surgeries  COGNITIVE FEATURES THAT CONTRIBUTE TO RISK:  Closed-mindedness Loss of executive function Polarized thinking Thought constriction (tunnel vision)    SUICIDE RISK:   Moderate:  Frequent suicidal ideation with limited intensity, and duration, some specificity in terms of plans, no associated intent, good self-control, limited dysphoria/symptomatology, some risk factors present, and identifiable protective factors, including available and accessible social support.  PLAN OF CARE: Patient admitted voluntarily, emergently from [facility] for depression, anxiety and suicidal ideations. She has history of eating disorder.   I certify that inpatient services furnished can reasonably be expected to improve the patient's condition.  Salisha Bardsley,JANARDHAHA R. 05/14/2013, 8:49 AM

## 2013-05-14 NOTE — [facility] Suicide Risk Assessment (Signed)
[facility] INPATIENT:  Family/Significant Other Suicide Prevention Education  Suicide Prevention Education:  Education Completed; Start [place], Sister, [phone];  has been identified by the patient as the family member/significant other with whom the patient will be residing, and identified as the person(s) who will aid the patient in the event of a mental health crisis (suicidal ideations/suicide attempt).  With written consent from the patient, the family member/significant other has been provided the following suicide prevention education, prior to the and/or following the discharge of the patient.  The suicide prevention education provided includes the following:  Suicide risk factors  Suicide prevention and interventions  National Suicide Hotline telephone number  [facility] assessment telephone number  [facility] Emergency Assistance 911  [facility] and/or Residential Mobile Crisis Unit telephone number  Request made of family/significant other to:  Remove weapons (e.g., guns, rifles, knives), all items previously/currently identified as safety concern. Patient does not have access to guns.    Remove drugs/medications (over-the-counter, prescriptions, illicit drugs), all items previously/currently identified as a safety concern.  The family member/significant other verbalizes understanding of the suicide prevention education information provided.  The family member/significant other agrees to remove the items of safety concern listed above.  Wynn Banker 05/14/2013, 12:57 PM

## 2013-05-14 NOTE — Tx Team (Signed)
Interdisciplinary Treatment Plan Update   Date Reviewed:  05/14/2013  Time Reviewed:  10:40 AM  Progress in Treatment:   Attending groups: Yes Participating in groups: Yes Taking medication as prescribed: Yes  Tolerating medication: Yes Family/Significant other contact made: No, but consent provided for collateral contact Patient understands diagnosis: Yes  Discussing patient identified problems/goals with staff: Yes Medical problems stabilized or resolved: Yes Denies suicidal/homicidal ideation: Yes Patient has not harmed self or others: Yes  For review of initial/current patient goals, please see plan of care.  Estimated Length of Stay:  3-4 days  Reasons for Continued Hospitalization:  Anxiety Depression Medication stabilization   New Problems/Goals identified:    Discharge Plan or Barriers:   Home with outpatient follow up with [facility] and Dr. Florentina Jenny,  Additional Comments:   Caroline Bolton is a 22 y.o. Female, TEFL teacher with Psychology and biology admitted voluntarily and emergently from [facility] and [facility] accompanied by sister for depression, anxiety and suicidal ideations. Patient has been stressed about her current stresses like college, future carrier, finances and family stresses and relationships. She has been crying, not being happy, over sleeping, binging and vomiting to avoid overweight.   Attendees:  Patient:  05/14/2013 10:40 AM   Signature: Mervyn Gay, MD 05/14/2013 10:40 AM  Signature:  Verne Spurr, PA 05/14/2013 10:40 AM  Signature: Harold Barban, RN 05/14/2013 10:40 AM  Signature:Beverly Terrilee Croak, RN 05/14/2013 10:40 AM  Signature:   05/14/2013 10:40 AM  Signature:  Juline Patch, LCSW 05/14/2013 10:40 AM  Signature:  Reyes Ivan, LCSW 05/14/2013 10:40 AM  Signature:  Maseta Dorley,Care Coordinator 05/14/2013 10:40 AM  Signature:   05/14/2013 10:40 AM  Signature:  05/14/2013  10:40 AM  Signature:    05/14/2013  10:40 AM  Signature:    05/14/2013  10:40 AM    Scribe for Treatment Team:   Juline Patch,  05/14/2013 10:40 AM

## 2013-05-14 NOTE — Progress Notes (Signed)
D: Patient appropriate and cooperative with staff and peers. Sad and tearful at times, brightens on approach. She reported on the self inventory sheet that her sleep and ability to pay attention are poor, appetite is improving, and energy level is normal. Patient rated depression "8" and feelings of hopelessness "7". She's participating in groups. Interactive with peers in the milieu. Compliant with medication regimen.   A: Support and encouragement provided to patient. Scheduled medications administered per MD orders. Maintain Q15 minute checks for safety.  R: Patient receptive. Denies SI/HI/AVH. Patient remains safe.

## 2013-05-14 NOTE — [facility] Group Notes (Addendum)
[facility] LCSW Group Therapy  Feelings Around Relapse 1:15 -2:30        05/14/2013  3:45 PM   Type of Therapy:  Group Therapy  Participation Level:  Minimal  Participation Quality:  Minimal  Affect:  Appropriate, Depressed  Cognitive:  Attentive Appropriate  Insight:  Developing/Improving  Engagement in Therapy: Developing/Improving  Modes of Intervention:  Discussion Exploration Problem-Solving Supportive  Summary of Progress/Problems:  The topic for today was feelings around relapse.    Patient listened attentively but did not engage in discussion.   Caroline Bolton 05/14/2013  3:45 PM

## 2013-05-15 NOTE — Progress Notes (Signed)
Psychoeducational Group Note  Date:  05/15/2013 Time:  0915  Group Topic/Focus:  Building Self Esteem:   The Focus of this group is helping patients become aware of the effects of self-esteem on their lives, the things they and others do that enhance or undermine their self-esteem, seeing the relationship between their level of self-esteem and the choices they make and learning ways to enhance self-esteem.  Participation Level: Did Not Attend  Participation Quality:  Not Applicable  Affect:  Not Applicable  Cognitive:  Not Applicable  Insight:  Not Applicable  Engagement in Group: Not Applicable  Additional Comments:    Rich Brave 05/15/2013, 10:30 AM

## 2013-05-15 NOTE — Progress Notes (Signed)
Patient ID: Caroline Bolton, female   DOB: 02/07/1991, 22 y.o.   MRN: [id number]   S:   Seen today. Good sleep and thinks her mood is getting better now. Concerned about having bipolar disorder now.    Psychiatric Specialty Exam:  Physical Exam   ROS negative    General Appearance: Casual and Fairly Groomed   Eye Contact:: Minimal   Speech: Clear and Coherent   Volume: Normal   Mood: Anxious, Depressed, Dysphoric, Hopeless and Worthless   Affect: Depressed and Tearful   Thought Process: Goal Directed and Intact   Orientation: Full (Time, Place, and Person)   Thought Content: Rumination   Suicidal Thoughts: denies  Homicidal Thoughts: No   Memory: Immediate; Fair   Judgement: Impaired   Insight: Lacking   Psychomotor Activity: Psychomotor Retardation   Concentration: Fair   Recall: Fair   Akathisia: NA   Handed: Right   AIMS (if indicated):   Assets: Communication Skills  Desire for Improvement  Housing  Physical Health  Resilience  Social Support  Vocational/Educational                Psychological Evaluations:  Assessment:  DSM5:  Schizophrenia Disorders:  Obsessive-Compulsive Disorders:  Trauma-Stressor Disorders:  Substance/Addictive Disorders: Cannabis Use Disorder - Moderate 9304.30)  Depressive Disorders: Major Depressive Disorder - Severe (296.23)  AXIS I: Generalized Anxiety Disorder, Major Depression, Recurrent severe, Substance Abuse and Bulimia  AXIS II: Cluster B Traits  AXIS III:  Past Medical History   Diagnosis  Date   .  Migraines    .  Environmental allergies    .  Anxiety    .  Depression     AXIS IV: educational problems, occupational problems, other psychosocial or environmental problems, problems related to social environment and problems with primary support group  AXIS V: 35 Treatment Plan/Recommendations: Patient admitted for depression, anxiety and suicidal thoughts and eating disorder.  Treatment Plan Summary:    Daily  contact with patient to assess and evaluate symptoms and progress in treatment  Continue Medication management

## 2013-05-15 NOTE — Progress Notes (Signed)
[facility] Group Notes:  (Nursing/MHT/Case Management/Adjunct)  Date:  05/14/2013 Time:  2000  Type of Therapy:  Psychoeducational Skills  Participation Level:  Active  Participation Quality:  Appropriate  Affect:  Appropriate  Cognitive:  Alert  Insight:  Good  Engagement in Group:  Engaged  Modes of Intervention:  Education  Summary of Progress/Problems: The patient described her day as having been "ok". She shared with the group that she made "progress" today in the sense that she spoke more with her peers. The patient shared that prior to coming to the hospital, her therapist had encouraged her to talk more. She also mentioned in group that she had periods of feeling "up and down" where she would feel energetic and then feel very sad. Her goal for tomorrow is to get more rest and to prepare herself to deal with her parents.   Tiago Humphrey S 05/15/2013, 1:20 AM

## 2013-05-15 NOTE — Progress Notes (Signed)
Patient observed up in the dayroom watching tv and talking with peers. Patient attended group and reports she had an ok day but poor sleep on the previous night. Patient is anxious about her visit with her parents on tomorrow. Writer encouraged patient to journal or try taking her sleep medication early so she will feel rested on tomorrow. Patient became tearful at times when discussing her feelings. She reports having crying spells off and on, she feel that her medication is no longer working for her. Support and encouragement given. Safety maintained on unit. Will continue to monitor.

## 2013-05-15 NOTE — [facility] Group Notes (Signed)
[facility] Group Notes:  (Clinical Social Work)  05/15/2013   3:00-4:00PM  Summary of Progress/Problems:   The main focus of today's process group was for the patient to identify ways in which they have sabotaged their own mental health wellness/recovery.  Motivational interviewing was used to explore the reasons they engage in this behavior, and reasons they may have for wanting to change.  The Stages of Change were explained to the group using a handout, and patients identified where they are with regard to changing self-defeating behaviors.  The patient expressed that she has made herself stop crying, has always internalized her problems, to the extent that recently she could not stop crying because it had to all come out.  She stated she runs from things, hides things, in order to not have to cope with problems.  She wants to change this and is in Preparation Stage of change.  Type of Therapy:  Process Group  Participation Level:  Active  Participation Quality:  Attentive, Sharing and Supportive  Affect:  Blunted  Cognitive:  Oriented  Insight:  Engaged  Engagement in Therapy:  Engaged  Modes of Intervention:  Education, Motivational Interviewing   Ambrose Mantle, LCSW 05/15/2013, 4:23 PM

## 2013-05-15 NOTE — Progress Notes (Signed)
Psychoeducational Group Note  Date:  05/15/2013 Time:    Group Topic/Focus:  Identifying Needs:   The focus of this group is to help patients identify their personal needs that have been historically problematic and identify healthy behaviors to address their needs.  Participation Level: Did Not Attend  Participation Quality:  Not Applicable  Affect:  Not Applicable  Cognitive:  Not Applicable  Insight:  Not Applicable  Engagement in Group: Not Applicable  Additional Comments:    Rich Brave 05/15/2013, 4:38 PM

## 2013-05-15 NOTE — Progress Notes (Signed)
Patient ID: Claressa Hughley, female   DOB: 01/12/1991, 22 y.o.   MRN: [id number] D: Pt is awake and active on the unit this AM. Pt denies SI/HI and A/V hallucinations. Pt rates their depression at 7and hopelessness at 5. Pt's mood and affect are anxious. Pt writes that she wishes to "put me first and take self assessment daily." Pt is anticipating a visit from her family and is anxious and irritable. Pt states that her parents don't understand her and she demands that they accept her the way she is. Writer suggested that her expectations may be setting her up for disappointment. We explored this concept and pt stated that the "light came on," and realizes that she does need to accept them for who they are. She also knows they love her and she has a lot to be grateful for.   A: Encouraged pt to discuss feelings with staff and administered medication per MD orders. Writer also encouraged pt to participate in groups.  R: Pt is attending groups and tolerating medications well. Writer will continue to monitor. 15 minute checks are ongoing for safety.

## 2013-05-15 NOTE — Progress Notes (Signed)
Psychoeducational Group Note      Date:  05/15/2013 Time  Sept 11, 2014 AM  Group Topic/Focus:  Building Self Esteem:   The Focus of this group is helping patients become aware of the effects of self-esteem on their lives, the things they and others do that enhance or undermine their self-esteem, seeing the relationship between their level of self-esteem and the choices they make and learning ways to enhance self-esteem.  Participation Level: Did Not Attend  Participation Quality:  Not Applicable  Affect:  Not Applicable  Cognitive:  Not Applicable  Insight:  Not Applicable  Engagement in Group: Not Applicable  Additional Comments:    Rich Brave 05/15/2013, 10:24 AM

## 2013-05-16 NOTE — Progress Notes (Signed)
Report received from C. Microbiologist. Writer entered patients room and observed her lying in bed asleep with eyes closed and respirations even, no distress noted. Safety maintained on unit, will continue to monitor.

## 2013-05-16 NOTE — Progress Notes (Signed)
Report received from B. McNichols RN. Writer entered patients room and observed her lying in bed asleep , eyes closed, respirations even and no distress noted. Safety maintained on unit, will continue to monitor.

## 2013-05-16 NOTE — Progress Notes (Signed)
Patient ID: Caroline Bolton, female   DOB: [birth year], 22 y.o.   MRN: [id number]   S:   More talkative today.  Good sleep and thinks her mood is getting better. She is attending groups now.    Psychiatric Specialty Exam:  Physical Exam   ROS negative    General Appearance: Casual and Fairly Groomed   Eye Contact:: Minimal   Speech: Clear and Coherent   Volume: Normal   Mood: Anxious,   Affect: Depressed and Tearful   Thought Process: Goal Directed and Intact   Orientation: Full (Time, Place, and Person)   Thought Content: Rumination   Suicidal Thoughts: denies  Homicidal Thoughts: No   Memory: Immediate; Fair   Judgement: Impaired   Insight: Lacking   Psychomotor Activity: Psychomotor Retardation   Concentration: Fair   Recall: Fair   Akathisia: NA   Handed: Right   AIMS (if indicated):   Assets: Communication Skills  Desire for Improvement  Housing  Physical Health  Resilience  Social Support  Vocational/Educational                Psychological Evaluations:  Assessment:  DSM5:  Schizophrenia Disorders:  Obsessive-Compulsive Disorders:  Trauma-Stressor Disorders:  Substance/Addictive Disorders: Cannabis Use Disorder - Moderate 9304.30)  Depressive Disorders: Major Depressive Disorder - Severe (296.23)  AXIS I: Generalized Anxiety Disorder, Major Depression, Recurrent severe, Substance Abuse and Bulimia  AXIS II: Cluster B Traits  AXIS III:  Past Medical History   Diagnosis  Date   .  Migraines    .  Environmental allergies    .  Anxiety    .  Depression     AXIS IV: educational problems, occupational problems, other psychosocial or environmental problems, problems related to social environment and problems with primary support group  AXIS V: 35 Treatment Plan/Recommendations: Patient admitted for depression, anxiety and suicidal thoughts and eating disorder.  Treatment Plan Summary:    Daily contact with patient to assess and evaluate symptoms  and progress in treatment  Continue Medication management

## 2013-05-16 NOTE — [facility] Group Notes (Signed)
[facility] Group Notes:  (Clinical Social Work)  05/16/2013   3:00-4:00PM  Summary of Progress/Problems:   The main focus of today's process group was to   identify the patient's current support system and decide on other supports that can be put in place.  The picture on workbook was used to discuss why additional supports are needed, and a hand-out was distributed with four definitions/levels of support, then used to talk about how patients have given and received all different kinds of support.  An emphasis was placed on using counselor, doctor, therapy groups, 12-step groups, and problem-specific support groups to expand supports.  The patient expressed full comprehension of the concepts presented, and agreed that there is a need to add more supports.  She is very engaged with another patient's negative perceptions and keeps trying to talk her out of them.  Type of Therapy:  Process Group  Participation Level:  Active  Participation Quality:  Attentive and Sharing  Affect:  Blunted  Cognitive:  Appropriate  Insight:  Developing/Improving  Engagement in Therapy:  Engaged  Modes of Intervention:  Education,  Support and [employer], LCSW 05/16/2013, 4:19 PM

## 2013-05-16 NOTE — Progress Notes (Signed)
[facility] Group Notes:  (Nursing/MHT/Case Management/Adjunct)  Date:  05/15/2013 Time:  2000  Type of Therapy:  Psychoeducational Skills  Participation Level:  Active  Participation Quality:  Attentive  Affect:  Appropriate  Cognitive:  Appropriate  Insight:  Appropriate  Engagement in Group:  Improving  Modes of Intervention:  Education  Summary of Progress/Problems: The patient verbalized that she had a good day. She shared with the group that she had a positive experience when her parents came in to visit. She also expressed that her anxiety was high in preparation for the family visit, but she was able to keep herself calm. Her goal is to have another good day.   Caroline Bolton S 05/16/2013, 2:55 AM

## 2013-05-16 NOTE — Progress Notes (Signed)
Date: 05/16/2013  Time: 1015  Group Topic/Focus:  Making Healthy Choices: The focus of this group is to help patients identify negative/unhealthy choices they were using prior to admission and identify positive/healthier coping strategies to replace them upon discharge.  Participation Level: Active  Participation Quality: Appropriate  Affect: Appropriate  Cognitive: Oriented  Insight: Improving  Engagement in Group: Improving  Additional Comments: was engaged in the discussion and partisipated  Eknoor Novack A  05/16/2013  

## 2013-05-16 NOTE — Progress Notes (Signed)
Patient ID: Caroline Bolton, female   DOB: 1991-04-30, 22 y.o.   MRN: [id number] D: Pt is awake and active on the unit this AM. Pt denies SI/HI and A/V hallucinations. Pt rates their depression at 7 and hopelessness at 4. Pt's mood is appropriate and her affect is anxious. Pt writes that she will "practice concentrating." Pt had c/o difficulty concentrating, especially when reading. Writer explained that concentration requires practice and can be developed over time. Pt remains receptive to staff input, but states that she struggles with college level reading. However, she acknowledges the need to work on this deficit her self.   A: Encouraged pt to discuss feelings with staff and administered medication per MD orders. Writer also encouraged pt to participate in groups.  R: Pt is attending groups and tolerating medications well. Writer will continue to monitor. 15 minute checks are ongoing for safety.

## 2013-05-17 DIAGNOSIS — F502 Bulimia nervosa: Secondary | ICD-10-CM

## 2013-05-17 DIAGNOSIS — F411 Generalized anxiety disorder: Secondary | ICD-10-CM

## 2013-05-17 DIAGNOSIS — F1910 Other psychoactive substance abuse, uncomplicated: Secondary | ICD-10-CM

## 2013-05-17 DIAGNOSIS — F332 Major depressive disorder, recurrent severe without psychotic features: Principal | ICD-10-CM

## 2013-05-17 NOTE — [facility] Group Notes (Signed)
[facility] LCSW Group Therapy  05/17/2013  1:15 PM   Type of Therapy:  Group Therapy  Participation Level:  Active  Participation Quality:  Appropriate and Attentive  Affect:  Appropriate and Calm  Cognitive:  Alert and Appropriate  Insight:  Developing/Improving and Engaged  Engagement in Therapy:  Developing/Improving and Engaged  Modes of Intervention:  Clarification, Confrontation, Discussion, Education, Exploration, Limit-setting, Orientation, Problem-solving, Rapport Building, Dance movement psychotherapist, Socialization and Support  Summary of Progress/Problems: Pt identified obstacles faced currently and processed barriers involved in overcoming these obstacles. Pt identified steps necessary for overcoming these obstacles and explored motivation (internal and external) for facing these difficulties head on. Pt further identified one area of concern in their lives and chose a goal to focus on for today.  Pt shared that her obstacle is accepting people as they are as she cannot change them or expect otherwise from them.  Pt encouraged peers by sharing that's he knew she wasn't doing well and asked for help.  Pt actively participated and was engaged in group discussion.    Reyes Ivan, LCSWA 05/17/2013 3:09 PM

## 2013-05-17 NOTE — Tx Team (Signed)
Interdisciplinary Treatment Plan Update (Adult)  Date: 05/17/2013  Time Reviewed:  9:45 AM  Progress in Treatment: Attending groups: Yes Participating in groups:  Yes Taking medication as prescribed:  Yes Tolerating medication:  Yes Family/Significant othe contact made: Yes Patient understands diagnosis:  Yes Discussing patient identified problems/goals with staff:  Yes Medical problems stabilized or resolved:  Yes Denies suicidal/homicidal ideation: Yes Issues/concerns per patient self-inventory:  Yes Other:  New problem(s) identified: N/A  Discharge Plan or Barriers: Pt has follow up scheduled with Dr. Lolly Mustache and [facility] for medication management and therapy.    Reason for Continuation of Hospitalization: Anxiety Depression Medication Stabilization  Comments: N/A  Estimated length of stay: 2-3 days  For review of initial/current patient goals, please see plan of care.  Attendees: Patient:     Family:     Physician:  Dr. Javier Glazier 05/17/2013 12:17 PM   Nursing:   Quintella Reichert, RN 05/17/2013 12:17 PM   Clinical Social Worker:  Reyes Ivan, LCSWA 05/17/2013 12:17 PM   Other: Verne Spurr, PA 05/17/2013 12:17 PM   Other:  Frankey Shown, MA care coordination 05/17/2013 12:17 PM   Other:  Neill Loft, RN 05/17/2013 12:17 PM   Other:     Other:    Other:    Other:    Other:    Other:    Other:     Scribe for Treatment Team:   Carmina Miller, 05/17/2013 12:17 PM

## 2013-05-17 NOTE — Progress Notes (Signed)
Adult Psychoeducational Group Note  Date:  05/17/2013 Time:  11:00am  Group Topic/Focus:  Self Care:   The focus of this group is to help patients understand the importance of self-care in order to improve or restore emotional, physical, spiritual, interpersonal, and financial health.  Participation Level:  Active  Participation Quality:  Appropriate and Attentive  Affect:  Appropriate  Cognitive:  Alert and Appropriate  Insight: Appropriate  Engagement in Group:  Engaged  Modes of Intervention:  Discussion and Education  Additional Comments:   Pt attended and participated in group. Discussion today was on self care. When ask What am I like when I am feeling well and What do I need to do less often to keep my overall wellbeing? Pt stated when she is feeling good she doesn't feel like she has to be in control of everything, she wants to get out of bed, has more energy, not feeling fake, and she is not feeding into her thoughts.  Shelly Bombard D 05/17/2013, 2:05 PM

## 2013-05-17 NOTE — [facility] Group Notes (Signed)
[facility] LCSW Aftercare Discharge Planning Group Note   05/17/2013 8:45 AM  Participation Quality:  Alert and Appropriate   Mood/Affect:  Appropriate, Flat and Depressed  Depression Rating:  6  Anxiety Rating:  10  Thoughts of Suicide:  Pt denies SI/HI  Will you contract for safety?   Yes  Current AVH:  Pt denies  Plan for Discharge/Comments:  Pt attended discharge planning group and actively participated in group.  CSW provided pt with today's workbook.  Pt has follow up scheduled at [facility] for therapy and [facility] for medication management.  Pt will return home in [place].  No further needs voiced by pt at this time.    Transportation Means: Pt reports access to transportation  Supports: No supports mentioned at this time  Reyes Ivan, LCSWA 05/17/2013 10:47 AM

## 2013-05-17 NOTE — Progress Notes (Signed)
Adult Psychoeducational Group Note  Date:  05/17/2013 Time:  10:40 PM  Group Topic/Focus:  Wrap-Up Group:   The focus of this group is to help patients review their daily goal of treatment and discuss progress on daily workbooks.  Participation Level:  Active  Participation Quality:  Appropriate  Affect:  Appropriate  Cognitive:  Appropriate  Insight: Appropriate  Engagement in Group:  Engaged  Modes of Intervention:  Support  Additional Comments:  Pt stated that she was able to tlk to her mom and that she feels like their relationship is turning for the better because her mother seemed to talk more about what may be concerning her and more attuned with what's going on with her, her needs, and was overall genuine. She feels as though they are able to communicate better.   Derron Pipkins 05/17/2013, 10:40 PM

## 2013-05-17 NOTE — Progress Notes (Signed)
[facility] MD Progress Note  05/17/2013 11:11 PM Caroline Bolton  MRN:  [id number]  Subjective: Met with Caroline Bolton for the first time today. Discussed her medications at length. She is easily escalated up when she is uncomfortable and has a very dramatic presentation when she does escalate. She can self sooth with reassurance and deep breathing. Did discuss stopping the Singulair due to history of anecdotal personality changes, also discussed holding off the topamax for a few days to see if she has some clearing in her cognition which she reports has been an issue for her and worsening today.   Diagnosis:   DSM5: DSM5:  Schizophrenia Disorders:  Obsessive-Compulsive Disorders:  Trauma-Stressor Disorders:  Substance/Addictive Disorders: Cannabis Use Disorder - Moderate 9304.30)  Depressive Disorders: Major Depressive Disorder - Severe (296.23)  AXIS I: Generalized Anxiety Disorder, Major Depression, Recurrent severe, Substance Abuse and Bulimia  AXIS II: Cluster B Traits  AXIS III:  Past Medical History   Diagnosis  Date   .  Migraines    .  Environmental allergies    .  Anxiety    .  Depression     AXIS IV: educational problems, occupational problems, other psychosocial or environmental problems, problems related to social environment and problems with primary support group  AXIS V: 41-50 serious symptoms   ADL's:  Intact  Sleep: Good  Appetite:  Good  Suicidal Ideation:  denies Homicidal Ideation:  denies AEB (as evidenced by):  Psychiatric Specialty Exam: Review of Systems  Constitutional: Negative.  Negative for fever, chills, weight loss, malaise/fatigue and diaphoresis.  HENT: Negative for congestion and sore throat.   Eyes: Negative for blurred vision, double vision and photophobia.  Respiratory: Negative for cough, shortness of breath and wheezing.   Cardiovascular: Negative for chest pain, palpitations and PND.  Gastrointestinal: Negative for heartburn, nausea, vomiting,  abdominal pain, diarrhea and constipation.  Musculoskeletal: Negative for falls, joint pain and myalgias.  Neurological: Negative for dizziness, tingling, tremors, sensory change, speech change, focal weakness, seizures, loss of consciousness, weakness and headaches.  Endo/Heme/Allergies: Negative for polydipsia. Does not bruise/bleed easily.  Psychiatric/Behavioral: Negative for depression, suicidal ideas, hallucinations, memory loss and substance abuse. The patient is not nervous/anxious and does not have insomnia.     Blood pressure 130/85, pulse 93, temperature 97.7 F (36.5 C), temperature source Oral, resp. rate 18, height 5\' 6"  (1.676 m), weight 87.091 kg (192 lb), last menstrual period 05/13/2013.Body mass index is 31 kg/(m^2).  General Appearance: Disheveled  Eye Contact::  Good  Speech:  Clear and Coherent  Volume:  Normal  Mood:  Anxious  Affect:  drammatic  Thought Process:  Goal Directed  Orientation:  Full (Time, Place, and Person)  Thought Content:  WDL  Suicidal Thoughts:  No  Homicidal Thoughts:  No  Memory:  Immediate;   Fair  Judgement:  Intact  Insight:  Shallow  Psychomotor Activity:  Restlessness  Concentration:  Poor  Recall:  Fair  Akathisia:  No  Handed:  Right  AIMS (if indicated):     Assets:  Communication Skills Desire for Improvement  Sleep:  Number of Hours: 6.75   Current Medications: Current Facility-Administered Medications  Medication Dose Route Frequency Provider Last Rate Last Dose  . acetaminophen (TYLENOL) tablet 650 mg  650 mg Oral Q6H PRN Shuvon Rankin, NP   650 mg at 05/16/13 1956  . alum & mag hydroxide-simeth (MAALOX/MYLANTA) 200-200-20 MG/5ML suspension 30 mL  30 mL Oral Q4H PRN Shuvon Rankin, NP      .  buPROPion [place] SR) 12 hr tablet 100 mg  100 mg Oral Daily Nehemiah Settle, MD   100 mg at 05/17/13 0843  . busPIRone (BUSPAR) tablet 7.5 mg  7.5 mg Oral BID Nehemiah Settle, MD   7.5 mg at 05/17/13 1639  .  hydrOXYzine (ATARAX/VISTARIL) tablet 50 mg  50 mg Oral QHS PRN,MR X 1 Nehemiah Settle, MD   50 mg at 05/17/13 2109  . loratadine (CLARITIN) tablet 10 mg  10 mg Oral Daily Shuvon Rankin, NP   10 mg at 05/17/13 0844  . magnesium hydroxide (MILK OF MAGNESIA) suspension 30 mL  30 mL Oral Daily PRN Shuvon Rankin, NP      . multivitamin with minerals tablet 1 tablet  1 tablet Oral Daily Shuvon Rankin, NP   1 tablet at 05/17/13 0844  . topiramate (TOPAMAX) tablet 50 mg  50 mg Oral BID Shuvon Rankin, NP   50 mg at 05/17/13 1640    Lab Results: No results found for this or any previous visit (from the past 48 hour(s)).  Physical Findings: AIMS: Facial and Oral Movements Muscles of Facial Expression: None, normal Lips and Perioral Area: None, normal Jaw: None, normal Tongue: None, normal,Extremity Movements Upper (arms, wrists, hands, fingers): None, normal Lower (legs, knees, ankles, toes): None, normal, Trunk Movements Neck, shoulders, hips: None, normal, Overall Severity Severity of abnormal movements (highest score from questions above): None, normal Incapacitation due to abnormal movements: None, normal Patient's awareness of abnormal movements (rate only patient's report): No Awareness, Dental Status Current problems with teeth and/or dentures?: No Does patient usually wear dentures?: No  CIWA:  CIWA-Ar Total: 3 COWS:  COWS Total Score: 1  Treatment Plan Summary: Daily contact with patient to assess and evaluate symptoms and progress in treatment Medication management  Plan: 1. Continue crisis management and stabilization. 2. Medication management to reduce current symptoms to base line and improve patient's overall level of functioning 3. Treat health problems as indicated. 4. Develop treatment plan to decrease risk of relapse upon discharge and the need for     readmission. 5. Psycho-social education regarding relapse prevention and self care. 6. Health care follow up as  needed for medical problems. 7. Continue home medications where appropriate. 8. Patient will decline the topamax to see if this helps her cognition. 9. Also recommend discontinuing Singulair as it can agitate patients if it is not needed. 10. Education regarding self soothing techniques done as well. 11. ELOS: 2-3 days  Medical Decision Making Problem Points:  Established problem, stable/improving (1) Data Points:  Review of medication regiment & side effects (2)  I certify that inpatient services furnished can reasonably be expected to improve the patient's condition.  Rona Ravens. [place] RPAC 11:19 PM 05/17/2013  Reviewed the information documented and agree with the treatment plan.  Leshon Armistead,JANARDHAHA R. 05/18/2013 12:29 PM

## 2013-05-17 NOTE — Progress Notes (Signed)
D: : Patient's self inventory sheet, patient sleeps well, needs sleep medication, improving appetite, low energy level, poor attention span.  Rated depression 7, hopeless 5.  Denied withdrawals.  Denied SI.  Denied physical problems.  Worst pain #4.  Would like to have healthy relationships.  Stated she feels panicky.  Anxiety level is not as low as she would like.  Would like to discuss medications with MD. A:  Medications administered per MD orders.  Emotional support and encouragement given patient. R:  Denied SI and HI.  Denied A/V hallucinations.  Denied pain.  Will continue to monitor patient for safety with 15 minute checks.  Safety maintained.

## 2013-05-17 NOTE — Progress Notes (Signed)
[facility] Group Notes:  (Nursing/MHT/Case Management/Adjunct)  Date:  05/16/2013  Time:  2000  Type of Therapy:  Psychoeducational Skills  Participation Level:  Active  Participation Quality:  Appropriate  Affect:  Appropriate  Cognitive:  Appropriate  Insight:  Good  Engagement in Group:  Developing/Improving  Modes of Intervention:  Education  Summary of Progress/Problems: The patient shared in group this evening that she had a good day since she had a good visit with her mother and sister. She was also proud of the fact that she spoke with her two year old niece by phone who offered her additional encouragement. Her goal for tomorrow is to figure out/ plan for discharge. The patient's support system (theme of the day) is comprised of her mother and other family members.   Boby Eyer S 05/17/2013, 1:39 AM

## 2013-05-18 MED ORDER — DIVALPROEX SODIUM 250 MG PO DR TAB
250.0000 mg | DELAYED_RELEASE_TABLET | Freq: Two times a day (BID) | ORAL | Status: DC
  Administered 2013-05-18 – 2013-05-19 (×3): 250 mg via ORAL
  Filled 2013-05-18 (×8): qty 1

## 2013-05-18 MED ORDER — TRAZODONE HCL 50 MG PO TABS
50.0000 mg | ORAL_TABLET | Freq: Every day | ORAL | Status: DC
  Administered 2013-05-18: 50 mg via ORAL
  Filled 2013-05-18 (×3): qty 1

## 2013-05-18 MED ORDER — BUPROPION HCL ER (SR) 150 MG PO TB12
150.0000 mg | ORAL_TABLET | Freq: Every day | ORAL | Status: DC
  Administered 2013-05-19: 150 mg via ORAL
  Filled 2013-05-18 (×3): qty 1

## 2013-05-18 MED ORDER — BUSPIRONE HCL 10 MG PO TABS
10.0000 mg | ORAL_TABLET | Freq: Two times a day (BID) | ORAL | Status: DC
Start: 2013-05-18 — End: 2013-05-19
  Administered 2013-05-18 – 2013-05-19 (×3): 10 mg via ORAL
  Filled 2013-05-18 (×6): qty 1

## 2013-05-18 NOTE — Progress Notes (Signed)
D:  Patient's self inventory sheet, patient has fair sleep, good appetite, low energy level, poor attention span.  Rated depression 6, hopelessness 5.  Denied withdrawals.  Denied physical problems.  Worst pain #5.  Pain goal #1.  After discharge, plans to see psychiatrist.  "My anxiety is high.  Visenl just makes me sleepy."  Does have discharge plans.  No problems taking meds after discharge. A:  Medications administered per MD orders.  Emotional support and encouragement given patient. R:  Denied SI and HI.   Denied A/V hallucinations.  Denied pain.  Will continue 15 minute checks for safety.  Safety maintained.

## 2013-05-18 NOTE — Progress Notes (Signed)
Recreation Therapy Notes  Date: 10.13.2014 Time: 3:00pm Location: [address] Dayroom  Group Topic: Communication, Team Building, Problem Solving  Goal Area(s) Addresses:  Patient will effectively work with peer towards shared goal.  Patient will identify skill used to make activity successful.  Patient will identify how skills used during activity can be used to reach post d/c goals.   Behavioral Response: Engaged, Appropriate   Intervention: Problem Solving Activitiy  Activity: Life Boat. Patients were given a scenario about being on a sinking yacht. Patients were informed the yacht included 15 guest, 8 of which could be placed on the life boat, along with all group members. Individuals on guest list were of varying socioeconomic classes such as a Education officer, museum, Materials engineer, Midwife, Tree surgeon.   Education: Customer service manager, Discharge Planning   Education Outcome: Acknowledges understanding  Clinical Observations/Feedback: Patient actively engaged in activity, voicing her opinion and debating with group members appropriately. Patient made no contributions to wrap up discussion, but appeared to actively listen, as she maintained appropriate eye contact with speaker.   Marykay Lex Jaron Czarnecki, LRT/CTRS  Raliegh Scobie L 05/18/2013 9:02 AM

## 2013-05-18 NOTE — Progress Notes (Signed)
Adult Psychoeducational Group Note  Date:  05/18/2013 Time:  10:07 PM  Group Topic/Focus:  Wrap-Up Group:   The focus of this group is to help patients review their daily goal of treatment and discuss progress on daily workbooks.  Participation Level:  Active  Participation Quality:  Appropriate  Affect:  Appropriate  Cognitive:  Alert and Oriented  Insight: Appropriate  Engagement in Group:  Developing/Improving  Modes of Intervention:  Clarification, Exploration and Support  Additional Comments:  Patient stated that one positive is that she will be d/c. Patient stated that she was feeling better and her anxiety level decreased. Patient stated that she learned hoe to change/accept certain relationships. Patient stated that one positive is that she wants to exercise.  Darlis Wragg, Randal Buba 05/18/2013, 10:07 PM

## 2013-05-18 NOTE — Progress Notes (Signed)
Recreation Therapy Notes  Date: 10.14.2014 Time: 2:45pm Location: [address] Dayroom  Group Topic: Animal Assisted Activities (AAA)  Behavioral Response: Engaged, Attentive  Affect: Euthymic  Clinical Observations/Feedback: Dog Team: Raliegh & handler. Patient interacted appropriately with peer, dog team, LRT and MHT.   Myrick Mcnairy L Zakiyah Diop, LRT/CTRS  [place] Searles L 05/18/2013 4:08 PM 

## 2013-05-18 NOTE — Progress Notes (Signed)
[facility] MD Progress Note  05/18/2013 11:26 AM Caroline Bolton  MRN:  [id number]  Subjective: Patient complaint insomnia both initial and middle insomnia and increased anxiety which she describes as are not am going off in her chest. Patient is concerned about her Topamax causing memory deficits in concentration difficulties. Patient requested to change her to axetil Depakote and provided consent after brief discussion of risks and benefits. Patient has a very dramatic presentation when she does escalate or talk about peer is not behaving or d or is respecting and using fowl language.      Diagnosis:   DSM5: DSM5:  Schizophrenia Disorders:  Obsessive-Compulsive Disorders:  Trauma-Stressor Disorders:  Substance/Addictive Disorders: Cannabis Use Disorder - Moderate 9304.30)  Depressive Disorders: Major Depressive Disorder - Severe (296.23)  AXIS I: Generalized Anxiety Disorder, Major Depression, Recurrent severe, Substance Abuse and Bulimia  AXIS II: Cluster B Traits  AXIS III:  Past Medical History   Diagnosis  Date   .  Migraines    .  Environmental allergies    .  Anxiety    .  Depression     AXIS IV: educational problems, occupational problems, other psychosocial or environmental problems, problems related to social environment and problems with primary support group  AXIS V: 41-50 serious symptoms   ADL's:  Intact  Sleep: Good  Appetite:  Good  Suicidal Ideation:  denies Homicidal Ideation:  denies AEB (as evidenced by):  Psychiatric Specialty Exam: Review of Systems  Constitutional: Negative.  Negative for fever, chills, weight loss, malaise/fatigue and diaphoresis.  HENT: Negative for congestion and sore throat.   Eyes: Negative for blurred vision, double vision and photophobia.  Respiratory: Negative for cough, shortness of breath and wheezing.   Cardiovascular: Negative for chest pain, palpitations and PND.  Gastrointestinal: Negative for heartburn, nausea, vomiting,  abdominal pain, diarrhea and constipation.  Musculoskeletal: Negative for falls, joint pain and myalgias.  Neurological: Negative for dizziness, tingling, tremors, sensory change, speech change, focal weakness, seizures, loss of consciousness, weakness and headaches.  Endo/Heme/Allergies: Negative for polydipsia. Does not bruise/bleed easily.  Psychiatric/Behavioral: Negative for depression, suicidal ideas, hallucinations, memory loss and substance abuse. The patient is not nervous/anxious and does not have insomnia.     Blood pressure 151/96, pulse 86, temperature 98.3 F (36.8 C), temperature source Oral, resp. rate 16, height 5\' 6"  (1.676 m), weight 87.091 kg (192 lb), last menstrual period 05/13/2013.Body mass index is 31 kg/(m^2).  General Appearance: Disheveled  Eye Contact::  Good  Speech:  Clear and Coherent  Volume:  Normal  Mood:  Anxious  Affect:  drammatic  Thought Process:  Goal Directed  Orientation:  Full (Time, Place, and Person)  Thought Content:  WDL  Suicidal Thoughts:  No  Homicidal Thoughts:  No  Memory:  Immediate;   Fair  Judgement:  Intact  Insight:  Shallow  Psychomotor Activity:  Restlessness  Concentration:  Poor  Recall:  Fair  Akathisia:  No  Handed:  Right  AIMS (if indicated):     Assets:  Communication Skills Desire for Improvement  Sleep:  Number of Hours: 4.75   Current Medications: Current Facility-Administered Medications  Medication Dose Route Frequency Provider Last Rate Last Dose  . acetaminophen (TYLENOL) tablet 650 mg  650 mg Oral Q6H PRN Shuvon Rankin, NP   650 mg at 05/16/13 1956  . alum & mag hydroxide-simeth (MAALOX/MYLANTA) 200-200-20 MG/5ML suspension 30 mL  30 mL Oral Q4H PRN Shuvon Rankin, NP      . Melene Muller ON  05/19/2013] buPROPion (WELLBUTRIN SR) 12 hr tablet 150 mg  150 mg Oral Daily Nehemiah Settle, MD      . busPIRone (BUSPAR) tablet 10 mg  10 mg Oral BID Nehemiah Settle, MD      . divalproex (DEPAKOTE) DR  tablet 250 mg  250 mg Oral Q12H Nehemiah Settle, MD      . loratadine (CLARITIN) tablet 10 mg  10 mg Oral Daily Shuvon Rankin, NP   10 mg at 05/18/13 0804  . magnesium hydroxide (MILK OF MAGNESIA) suspension 30 mL  30 mL Oral Daily PRN Shuvon Rankin, NP      . multivitamin with minerals tablet 1 tablet  1 tablet Oral Daily Shuvon Rankin, NP   1 tablet at 05/18/13 0805  . traZODone (DESYREL) tablet 50 mg  50 mg Oral QHS Nehemiah Settle, MD        Lab Results: No results found for this or any previous visit (from the past 48 hour(s)).  Physical Findings: AIMS: Facial and Oral Movements Muscles of Facial Expression: None, normal Lips and Perioral Area: None, normal Jaw: None, normal Tongue: None, normal,Extremity Movements Upper (arms, wrists, hands, fingers): None, normal Lower (legs, knees, ankles, toes): None, normal, Trunk Movements Neck, shoulders, hips: None, normal, Overall Severity Severity of abnormal movements (highest score from questions above): None, normal Incapacitation due to abnormal movements: None, normal Patient's awareness of abnormal movements (rate only patient's report): No Awareness, Dental Status Current problems with teeth and/or dentures?: No Does patient usually wear dentures?: No  CIWA:  CIWA-Ar Total: 3 COWS:  COWS Total Score: 1  Treatment Plan Summary: Daily contact with patient to assess and evaluate symptoms and progress in treatment Medication management  Plan: 1. Continue crisis management and stabilization. 2. Medication management to reduce current symptoms to base line and improve patient's overall level of functioning Increased Wellbutirn SR 150 mg PO Qam Increase Buspirone 10 mg PO BID D/C Vistaril and start Trazodone for sleep D/C Topamax and start Depakote DR 250 mg PO BID/migraine prophylaxis 3. Treat health problems as indicated. 4. Develop treatment plan to decrease risk of relapse upon discharge and the need for      readmission. 5. Psycho-social education regarding relapse prevention and self care. 6. Health care follow up as needed for medical problems. 7. Continue home medications where appropriate. 8. Patient will decline the topamax to see if this helps her cognition. 9. Also recommend discontinuing Singulair as it can agitate patients if it is not needed. 10. Education regarding self soothing techniques done as well. 11. ELOS: 2 days  Medical Decision Making Problem Points:  Established problem, stable/improving (1) Data Points:  Review of medication regiment & side effects (2)  I certify that inpatient services furnished can reasonably be expected to improve the patient's condition.   Luan Urbani,JANARDHAHA R. 05/18/2013 2:42 PM

## 2013-05-18 NOTE — Progress Notes (Signed)
The focus of this group is to educate the patient on the purpose and policies of crisis stabilization and provide a format to answer questions about their admission.  The group details unit policies and expectations of patients while admitted.  Patient attended 0900 nurse education orientation group this morning.  Patient actively participated, appropriate affect, alert, appropriate insight and engagement.  Today patient will work on 3 goals for discharge.  

## 2013-05-18 NOTE — Progress Notes (Signed)
Adult Psychoeducational Group Note  Date:  05/18/2013 Time:  11:00am Group Topic/Focus:  Recovery Goals:   The focus of this group is to identify appropriate goals for recovery and establish a plan to achieve them.  Participation Level:  Active  Participation Quality:  Appropriate and Attentive  Affect:  Appropriate  Cognitive:  Alert and Appropriate  Insight: Appropriate  Engagement in Group:  Engaged  Modes of Intervention:  Discussion and Education  Additional Comments:   Pt attended and participated in group. Discussion today was on recovery and what is their definition of recovery and what was their breaking point. Pt stated recovery means being able to heal yourself of all things. Her breaking point is trying to be in control of things to the point you lose control of everything.  Shelly Bombard D 05/18/2013, 2:01 PM

## 2013-05-18 NOTE — [facility] Group Notes (Signed)
[facility] LCSW Group Therapy  05/18/2013  1:15 PM   Type of Therapy:  Group Therapy  Participation Level:  Active  Participation Quality:  Appropriate and Attentive  Affect:  Appropriate  Cognitive:  Alert and Appropriate  Insight:  Developing/Improving and Engaged  Engagement in Therapy:  Developing/Improving and Engaged  Modes of Intervention:  Clarification, Confrontation, Discussion, Education, Exploration, Limit-setting, Orientation, Problem-solving, Rapport Building, Dance movement psychotherapist, Socialization and Support  Summary of Progress/Problems: The topic for group therapy was feelings about diagnosis.  Pt actively participated in group discussion on their past and current diagnosis and how they feel towards this.  Pt also identified how society and family members judge them, based on their diagnosis as well as stereotypes and stigmas.   Pt shared that her diagnosis is bulimia, anxiety and depression.  Pt states that she feels there is more going on and wants an official diagnosis so she can have answers and so that her symptoms make sense.  Pt actively participated and was engaged in group discussion.    Reyes Ivan, LCSWA 05/18/2013 2:46 PM

## 2013-05-18 NOTE — Progress Notes (Signed)
D: Patient denies SI/HI/AVH. Patient rates hopelessness as 0,  depression as 5, and anxiety as 9.  Patient affect is depressed and anxious. Mood is depressed and anxious.  Pt states, "I have had increased anxiety since yesterday.  I feel like an alarm is going off in my head.  I feel like the medication changed has caused this.  I don't feel like myself.  I just want to isolate because the noise makes me anxious, but I know it's not good for me to isolate."  Patient did attend evening group. Patient visible on the milieu. No distress noted. A: Support and encouragement offered. Scheduled medications given to pt. Q 15 min checks continued for patient safety. R: Patient receptive. Patient remains safe on the unit.

## 2013-05-19 MED ORDER — TRAZODONE HCL 50 MG PO TABS: 50.0000 mg | ORAL_TABLET | Freq: Every day | ORAL | Status: DC

## 2013-05-19 MED ORDER — DIVALPROEX SODIUM 250 MG PO DR TAB: 250.0000 mg | DELAYED_RELEASE_TABLET | Freq: Two times a day (BID) | ORAL | Status: DC

## 2013-05-19 MED ORDER — BUPROPION HCL ER (SR) 150 MG PO TB12: 150.0000 mg | ORAL_TABLET | Freq: Every day | ORAL | Status: DC

## 2013-05-19 MED ORDER — BUSPIRONE HCL 10 MG PO TABS: 10.0000 mg | ORAL_TABLET | Freq: Two times a day (BID) | ORAL | Status: DC

## 2013-05-19 MED ORDER — TOPIRAMATE 50 MG PO TABS: 50.0000 mg | ORAL_TABLET | Freq: Two times a day (BID) | ORAL | Status: DC

## 2013-05-19 NOTE — Progress Notes (Signed)
Pt discharged per MD orders; pt currently denies SI/HI and auditory/visual hallucinations; pt was given education by RN regarding follow-up appointments and medications and pt denied any questions or concerns about these instructions; pt was then escorted to search room to retrieve her belongings by RN before being discharged to hospital lobby. 

## 2013-05-19 NOTE — [facility] Suicide Risk Assessment (Signed)
Suicide Risk Assessment  Discharge Assessment     Demographic Factors:  Adolescent or young adult, Low socioeconomic status, Living alone and Unemployed  Mental Status Per Nursing Assessment::   On Admission:  NA  Current Mental Status by Physician: NA  Loss Factors: Financial problems/change in socioeconomic status  Historical Factors: Prior suicide attempts, Family history of mental illness or substance abuse and Impulsivity  Risk Reduction Factors:   Sense of responsibility to family, Religious beliefs about death, Living with another person, especially a relative, Positive social support, Positive therapeutic relationship and Positive coping skills or problem solving skills  Continued Clinical Symptoms:  Depression:   Recent sense of peace/wellbeing Unstable or Poor Therapeutic Relationship Previous Psychiatric Diagnoses and Treatments Medical Diagnoses and Treatments/Surgeries  Cognitive Features That Contribute To Risk:  Polarized thinking    Suicide Risk:  Minimal: No identifiable suicidal ideation.  Patients presenting with no risk factors but with morbid ruminations; may be classified as minimal risk based on the severity of the depressive symptoms  Discharge Diagnoses:   AXIS I:  Major Depression, Recurrent severe and BULIMIA NERVOSA AXIS II:  Deferred AXIS III:   Past Medical History  Diagnosis Date  . Migraines   . Environmental allergies   . Anxiety   . Depression    AXIS IV:  economic problems, occupational problems, other psychosocial or environmental problems, problems related to social environment and problems with primary support group AXIS V:  61-70 mild symptoms  Plan Of Care/Follow-up recommendations:  Activity:  AS TOLERATED Diet:  REGULAR  Is patient on multiple antipsychotic therapies at discharge:  No   Has Patient had three or more failed trials of antipsychotic monotherapy by history:  No  Recommended Plan for Multiple Antipsychotic  Therapies: NA  Nehemiah Settle., MD 05/19/2013, 3:11 PM

## 2013-05-19 NOTE — Progress Notes (Signed)
D: Patient denies SI/HI and auditory and visual hallucinations. The patient has a depressed mood and affect. The patient rates her depression a 4 out of 10 and her hopelessness a 7 out of 10 (1 low/10 high). The patient states that she is working on changing her behaviors and learning new coping skills in order to prevent relapse when she leaves the hospital. The patient is attending groups on the unit and is interacting appropriately within the milieu.  A: Patient given emotional support from RN. Patient encouraged to come to staff with concerns and/or questions. Patient's medication routine continued. Patient's orders and plan of care reviewed.  R: Patient remains appropriate and cooperative. Will continue to monitor patient q15 minutes for safety.

## 2013-05-19 NOTE — [facility] Group Notes (Signed)
[facility] LCSW Group Therapy  Emotional Regulation 1:15 - 2: 30 PM        05/19/2013  2:48 PM   Type of Therapy:  Group Therapy  Participation Level:  Appropriate  Participation Quality:  Appropriate  Affect:  Appropriate  Cognitive:  Attentive Appropriate  Insight:  Engaged  Engagement in Therapy:  Engaged  Modes of Intervention:  Discussion Exploration Problem-Solving Supportive  Summary of Progress/Problems:  Group topic was emotional regulations.  Patient participated in the discussion and was able to identify an emotion that needed to regulated. She shared she deals with resentment that father was not there for her as a child and that mother is unable to express love.   Patient was able to identify approprite coping skills.  Wynn Banker 05/19/2013 2:48 PM

## 2013-05-19 NOTE — Discharge Summary (Signed)
Physician Discharge Summary Note  Patient:  Caroline Bolton is an 22 y.o., female MRN:  [id number] DOB:  11-29-1990 Patient phone:  [phone] (home)  Patient address:   [address] [place] [address],   Date of Admission:  05/13/2013 Date of Discharge: 05/19/2013  Reason for Admission:  Depression with suicidal idea  Discharge Diagnoses: Active Problems:   MDD (major depressive disorder)   Bulimia nervosa   Suicidal ideation  ROS  DSM5: Schizophrenia Disorders:  Obsessive-Compulsive Disorders:  Trauma-Stressor Disorders:  Substance/Addictive Disorders: Cannabis Use Disorder - Moderate 9304.30)  Depressive Disorders: Major Depressive Disorder - Severe (296.23)  AXIS I: Generalized Anxiety Disorder, Major Depression, Recurrent severe, Substance Abuse and Bulimia  AXIS II: Cluster B Traits  AXIS III:  Past Medical History   Diagnosis  Date   .  Migraines    .  Environmental allergies    .  Anxiety    .  Depression     AXIS IV: educational problems, occupational problems, other psychosocial or environmental problems, problems related to social environment and problems with primary support group  AXIS V: 41-50 serious symptoms  Level of Care:  OP  Hospital Course:  Caroline Bolton is a 23 y.o. 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 Medication management was discussed and implemented. She was encouraged to participate in unit programming. Medical problems were identified and treated appropriately. Home medication was restarted as needed.                             Caroline Bolton was evaluated each day by a clinical provider to ascertain the patient's response to treatment.  Improvement was noted by the patient's report of decreasing symptoms,  improved sleep and appetite, affect, medication tolerance, behavior, and participation in unit programming.  She was asked each day to complete a self inventory noting mood, mental status, pain, new symptoms, anxiety and concerns.               She responded well to medication and being in a therapeutic and supportive environment. Positive and appropriate behavior was noted and the patient was motivated for recovery. The patient worked closely with the treatment team and case manager to develop a discharge plan with appropriate goals. Coping skills, problem solving as well as relaxation therapies were also part of the unit programming.                By the day of discharge Caroline Bolton was in much improved condition than upon admission.  Symptoms were reported as significantly decreased or resolved completely. The patient denied SI/HI and voiced no AVH. She was motivated to continue taking medication with a goal of continued improvement in mental health.          Caroline Bolton was discharged home with a plan to follow up as noted below.     Consults:  None  Significant Diagnostic Studies:  None  Discharge Vitals:   Blood pressure 133/90, pulse 85, temperature 97.8 F (36.6 C), temperature source Oral, resp. rate 17, height 5\' 6"  (1.676 m), weight 87.091 kg (192 lb), last menstrual period 05/13/2013, SpO2 99.00%. Body mass index is 31 kg/(m^2). Lab  Results:   No results found for this or any previous visit (from the past 72 hour(s)).  Physical Findings: AIMS: Facial and Oral Movements Muscles of Facial Expression: None, normal Lips and Perioral Area: None, normal Jaw: None, normal Tongue: None, normal,Extremity Movements Upper (arms, wrists, hands, fingers): None, normal Lower (legs, knees, ankles, toes): None, normal, Trunk Movements Neck, shoulders, hips: None, normal, Overall Severity Severity of abnormal movements (highest score from questions above): None, normal Incapacitation due to  abnormal movements: None, normal Patient's awareness of abnormal movements (rate only patient's report): No Awareness, Dental Status Current problems with teeth and/or dentures?: No Does patient usually wear dentures?: No  CIWA:  CIWA-Ar Total: 2 COWS:  COWS Total Score: 3  Psychiatric Specialty Exam: See Psychiatric Specialty Exam and Suicide Risk Assessment completed by Attending Physician prior to discharge.  Discharge destination:  Home  Is patient on multiple antipsychotic therapies at discharge:  No   Has Patient had three or more failed trials of antipsychotic monotherapy by history:  No  Recommended Plan for Multiple Antipsychotic Therapies: NA  Discharge Orders   Future Orders Complete By Expires   Diet - low sodium heart healthy  As directed    Discharge instructions  As directed    Comments:     Take all of your medications as directed. Be sure to keep all of your follow up appointments.  If you are unable to keep your follow up appointment, call your Doctor's office to let them know, and reschedule.  Make sure that you have enough medication to last until your appointment. Be sure to get plenty of rest. Going to bed at the same time each night will help. Try to avoid sleeping during the day.  Increase your activity as tolerated. Regular exercise will help you to sleep better and improve your mental health. Eating a heart healthy diet is recommended. Try to avoid salty or fried foods. Be sure to avoid all alcohol and illegal drugs.   Increase activity slowly  As directed        Medication List    STOP taking these medications       Biotin 2500 MCG Caps     cholecalciferol 1000 UNITS tablet  Commonly known as:  VITAMIN D     CLA 1000 MG Caps     DMAE Bitartrate Powd     FLUoxetine 40 MG capsule  Commonly known as:  PROZAC     montelukast 10 MG tablet  Commonly known as:  SINGULAIR     multivitamin tablet     vitamin E 400 UNIT capsule      TAKE these  medications     Indication   buPROPion 150 MG 12 hr tablet  Commonly known as:  WELLBUTRIN SR  Take 1 tablet (150 mg total) by mouth daily. For depression.   Indication:  Major Depressive Disorder     busPIRone 10 MG tablet  Commonly known as:  BUSPAR  Take 1 tablet (10 mg total) by mouth 2 (two) times daily. For anxiety.   Indication:  Anxiety Disorder     cetirizine 10 MG tablet  Commonly known as:  ZYRTEC  Take 10 mg by mouth daily as needed for allergies.      divalproex 250 MG DR tablet  Commonly known as:  DEPAKOTE  Take 1 tablet (250 mg total) by mouth every 12 (twelve) hours. For mood stabilization.   Indication:  Migraine Headache     rizatriptan 10 MG tablet  Commonly known as:  MAXALT  Take 10 mg by mouth as needed for migraine. For migraines. May repeat in 2 hours if needed      topiramate 50 MG tablet  Commonly known as:  TOPAMAX  Take 1 tablet (50 mg total) by mouth 2 (two) times daily. For migraine prevention.   Indication:  Migraine Headache     traZODone 50 MG tablet  Commonly known as:  DESYREL  Take 1 tablet (50 mg total) by mouth at bedtime. For insomnia.   Indication:  Trouble Sleeping           Follow-up Information   Follow up with Dr. Jannifer [facility]  On 06/18/2013. (Friday, June 18, 2013 at 1:40 PM)    Contact information:   [address] [place], [place]   [address]  [phone]   [phone]      Follow up with Lenetta Quaker - [facility] On 05/20/2013. (Thursday, May 20, 2013 at [facility]. Please call upon discharge to confiirm appointment)    Contact information:   [address] [place], [place]   [address] Phone: [phone]          Follow-up recommendations:   Activities: Resume activity as tolerated. Diet: Heart healthy low sodium diet Tests: Follow up testing will be determined by your out patient provider. Comments:    Total Discharge Time:  Less than 30  minutes.  Signed: MASHBURN,NEIL 05/19/2013, 5:54 PM  Patient is seen personally for psych assessment, suicidal risk assessment and discussed with physician extenders. Developed discharge treatment plans and reviewed the information documented and agree with the treatment plan.  Francie Keeling,JANARDHAHA R. 05/25/2013 1:12 PM

## 2013-05-19 NOTE — Progress Notes (Signed)
Patient ID: Caroline Bolton, female   DOB: 09/27/1990, 22 y.o.   MRN: [id number]  D: Pt stated she had "a better day today", because she was able to get meds for anxiety. Stated she wasn't as "jittery and in peoples faces". Stated that she's happy that her mother and step father are accepting of her circumstances regarding her depression. Stated that her family is supportive.  A:  Support and encouragement was offered. 15 min checks continued for safety.  R: Pt remains safe.

## 2013-05-19 NOTE — Tx Team (Signed)
Interdisciplinary Treatment Plan Update   Date Reviewed:  05/19/2013  Time Reviewed:  10:26 AM  Progress in Treatment:   Attending groups: Yes Participating in groups: Yes Taking medication as prescribed: Yes  Tolerating medication: Yes Family/Significant other contact made: Yes, contact made with sister. Patient understands diagnosis: Yes  Discussing patient identified problems/goals with staff: Yes Medical problems stabilized or resolved: Yes Denies suicidal/homicidal ideation: Yes Patient has not harmed self or others: Yes  For review of initial/current patient goals, please see plan of care.  Estimated Length of Stay: Discharge today.  Reasons for Continued Hospitalization:   New Problems/Goals identified:    Discharge Plan or Barriers:   Home with outpatient follow up with [facility] and Dr. Jannifer Franklin  Additional Comments:     Attendees:  Patient:  05/19/2013 10:26 AM   Signature: Mervyn Gay, MD 05/19/2013 10:26 AM  Signature:  Verne Spurr, PA 05/19/2013 10:26 AM  Signature: Harold Barban, RN 05/19/2013 10:26 AM  Signature: Leighton Parody, RN 05/19/2013 10:26 AM  Signature:   Nestor Ramp, RN 05/19/2013 10:26 AM  Signature:  Juline Patch, LCSW 05/19/2013 10:26 AM  Signature:  Reyes Ivan, LCSW 05/19/2013 10:26 AM  Signature:  Maseta Dorley,Care Coordinator 05/19/2013 10:26 AM  Signature:   05/19/2013 10:26 AM  Signature:  05/19/2013  10:26 AM  Signature:    05/19/2013  10:26 AM  Signature:   05/19/2013  10:26 AM    Scribe for Treatment Team:   Juline Patch,  05/19/2013 10:26 AM

## 2013-05-19 NOTE — Progress Notes (Signed)
[facility] Adult Case Management Discharge Plan :  Will you be returning to the same living situation after discharge: Yes,  Returning home with family At discharge, do you have transportation home?:Yes,  Patient to arrange transporation. Do you have the ability to pay for your medications:Yes,  Patient is able to afford medications.  Release of information consent forms completed and in the chart;  Patient's signature needed at discharge.  Patient to Follow up at: Follow-up Information   Follow up with Dr. Jannifer [facility] - [facility]  On 06/18/2013. (Friday, June 18, 2013 at 1:40 PM)    Contact information:   [address] [place], [place]   [address]  [phone]   [phone]      Follow up with Lenetta Quaker - [facility] On 05/20/2013. (Thursday, May 20, 2013 at [facility]. Please call upon discharge to confiirm appointment)    Contact information:   [address] [place], [place]   [address] Phone: [phone]          Patient denies SI/HI:   Patient no longer endorsing SI/HI or other thoughts of self harm.     Safety Planning and Suicide Prevention discussed: .Reviewed with all patients during discharge planning group  Cherylann Hobday, [place] July 05/19/2013, 2:59 PM

## 2013-05-20 NOTE — Progress Notes (Signed)
Recreation Therapy Notes  Date: 10.15.2014 Time: 3:00pm Location: [address] Dayroom  Group Topic: Problem Solving  Goal Area(s) Addresses:  Patient will successfully target area of improvement.  Patient will identify what steps they need to take to accomplish improvements.   Behavioral Response: Appropriate  Intervention: Journaling  Activity: 40-20-10-5. Patient was asked to identify one thing they want to improve in their lives using 40 words. Patient was then asked to cut the numbers of words used to describe area of improvement to 20, then 10 and finally 5.   Education: Problem Solving, Coping Skills, Discharge Planning.   Education Outcome: Needs additional education  Clinical Observations/Feedback: Patient arrived to group session during last five minutes. Patient actively listened to group discussion and was informed of guidelines for group activity. Patient was encouraged to complete activity on own time. Patient stated she was agreeable to this and appeared to actively listen to group discussion she was present for as she maintained appropriate eye contact with speaker.   Marykay Lex Suhaas Agena, LRT/CTRS   Logyn Kendrick L 05/20/2013 10:06 AM

## 2013-05-24 NOTE — Progress Notes (Signed)
Patient Discharge Instructions:  After Visit Summary (AVS):   Faxed to:  05/24/13 Psychiatric Admission Assessment Note:   Faxed to:  05/24/13 Suicide Risk Assessment - Discharge Assessment:   Faxed to:  05/24/13 Faxed/Sent to the Next Level Care provider:  05/24/13 Faxed to [facility] @ [phone] Faxed to [facility] @ [phone]  Jerelene Redden, 05/24/2013, 2:07 PM

## 2013-06-02 ENCOUNTER — Ambulatory Visit (HOSPITAL_COMMUNITY): Payer: Self-pay | Admitting: Psychiatry

## 2013-11-16 ENCOUNTER — Other Ambulatory Visit (HOSPITAL_COMMUNITY)
Admission: RE | Admit: 2013-11-16 | Discharge: 2013-11-16 | Disposition: A | Discharge status: Home / Self Care | Source: Ambulatory Visit | Attending: Family Medicine | Admitting: Family Medicine

## 2013-11-16 ENCOUNTER — Other Ambulatory Visit: Payer: Self-pay | Admitting: Family Medicine

## 2013-11-16 DIAGNOSIS — Z01419 Encounter for gynecological examination (general) (routine) without abnormal findings: Secondary | ICD-10-CM | POA: Insufficient documentation

## 2013-12-30 ENCOUNTER — Telehealth: Payer: Self-pay | Admitting: Internal Medicine

## 2013-12-30 NOTE — Telephone Encounter (Signed)
Not a patient at our office--message taken in error under wrong patient.  Nothing further needed.

## 2018-09-22 LAB — HM PAP SMEAR: HM Pap smear: NEGATIVE

## 2018-10-21 ENCOUNTER — Ambulatory Visit (HOSPITAL_COMMUNITY)
Admission: EM | Admit: 2018-10-21 | Discharge: 2018-10-21 | Disposition: A | Discharge status: Home / Self Care | Payer: BLUE CROSS/BLUE SHIELD | Attending: Family Medicine | Admitting: Family Medicine

## 2018-10-21 ENCOUNTER — Encounter (HOSPITAL_COMMUNITY): Payer: Self-pay | Admitting: Emergency Medicine

## 2018-10-21 DIAGNOSIS — Z3202 Encounter for pregnancy test, result negative: Secondary | ICD-10-CM

## 2018-10-21 DIAGNOSIS — N3090 Cystitis, unspecified without hematuria: Secondary | ICD-10-CM | POA: Diagnosis present

## 2018-10-21 LAB — POCT PREGNANCY, URINE: PREG TEST UR: NEGATIVE

## 2018-10-21 LAB — POCT URINALYSIS DIP (DEVICE)
Bilirubin Urine: NEGATIVE
Glucose, UA: NEGATIVE mg/dL
Ketones, ur: NEGATIVE mg/dL
NITRITE: NEGATIVE
Protein, ur: 30 mg/dL — AB
Specific Gravity, Urine: 1.025 (ref 1.005–1.030)
Urobilinogen, UA: 0.2 mg/dL (ref 0.0–1.0)
pH: 6 (ref 5.0–8.0)

## 2018-10-21 MED ORDER — CEPHALEXIN 500 MG PO CAPS: 500.0000 mg | ORAL_CAPSULE | Freq: Two times a day (BID) | ORAL | 0 refills | Status: DC

## 2018-10-21 NOTE — ED Provider Notes (Signed)
[facility]    ASSESSMENT & PLAN:  1. Cystitis     Meds ordered this encounter  Medications  . cephALEXin (KEFLEX) 500 MG capsule    Sig: Take 1 capsule (500 mg total) by mouth 2 (two) times daily.    Dispense:  10 capsule    Refill:  0   Urine culture sent. Will notify patient when results available. Will follow up with her PCP or here if not showing improvement over the next 48 hours, sooner if needed.  Outlined signs and symptoms indicating need for more acute intervention. Patient verbalized understanding. After Visit Summary given.  SUBJECTIVE:  Caroline Bolton is a 28 y.o. female who complains of urinary frequency, urgency and dysuria for the past 3 days. No flank pain, fever, chills, abnormal vaginal discharge or bleeding. Hematuria: not present. Normal PO intake. No abdominal pain. No self treatment. Ambulatory without difficulty. Sexually active with a single female partner. No concern for STI. Does not desire testing.  LMP: Patient's last menstrual period was 09/22/2018.  ROS: As in HPI.  OBJECTIVE:  Vitals:   10/21/18 1549  BP: 129/84  Pulse: 70  Resp: 16  Temp: 98.5 F (36.9 C)  SpO2: 100%   Appears well, in no apparent distress. Abdomen is soft without tenderness, guarding, mass, rebound or organomegaly. No CVA tenderness or inguinal adenopathy noted.  Labs Reviewed  POCT URINALYSIS DIP (DEVICE) - Abnormal; Notable for the following components:      Result Value   Hgb urine dipstick LARGE (*)    Protein, ur 30 (*)    Leukocytes,Ua LARGE (*)    All other components within normal limits  URINE CULTURE  POCT PREGNANCY, URINE    Allergies  Allergen Reactions  . Sulfa Antibiotics Anaphylaxis, Hives and Swelling  . Pollen Extract     Past Medical History:  Diagnosis Date  . Anxiety   . Depression   . Environmental allergies   . Migraines    Social History   Socioeconomic History  . Marital status: Single    Spouse name: Not on file   . Number of children: Not on file  . Years of education: Not on file  . Highest education level: Not on file  Occupational History  . Not on file  Social Needs  . Financial resource strain: Not on file  . Food insecurity:    Worry: Not on file    Inability: Not on file  . Transportation needs:    Medical: Not on file    Non-medical: Not on file  Tobacco Use  . Smoking status: Passive Smoke Exposure - Never Smoker  . Smokeless tobacco: Never Used  Substance and Sexual Activity  . Alcohol use: No  . Drug use: Yes    Types: Marijuana    Comment: THC daily  . Sexual activity: Yes    Birth control/protection: Pill  Lifestyle  . Physical activity:    Days per week: Not on file    Minutes per session: Not on file  . Stress: Not on file  Relationships  . Social connections:    Talks on phone: Not on file    Gets together: Not on file    Attends religious service: Not on file    Active member of club or organization: Not on file    Attends meetings of clubs or organizations: Not on file    Relationship status: Not on file  . Intimate partner violence:    Fear of current  or ex partner: Not on file    Emotionally abused: Not on file    Physically abused: Not on file    Forced sexual activity: Not on file  Other Topics Concern  . Not on file  Social History Narrative  . Not on file   No family history on file.     Mardella Layman, MD 10/21/18 [phone]

## 2018-10-21 NOTE — ED Triage Notes (Signed)
Pt c/o burning, frequency, urgency x3 days. Lower pelvic pain as well.

## 2018-10-22 LAB — URINE CULTURE: Culture: <10000 — AB

## 2019-04-15 ENCOUNTER — Ambulatory Visit: Payer: BC Managed Care – PPO | Admitting: Family Medicine

## 2019-04-15 ENCOUNTER — Encounter: Payer: Self-pay | Admitting: Family Medicine

## 2019-04-15 ENCOUNTER — Other Ambulatory Visit: Payer: Self-pay

## 2019-04-15 VITALS — BP 122/72 | HR 77 | Temp 98.2°F | Ht 67.5 in | Wt 204.6 lb

## 2019-04-15 DIAGNOSIS — Z13220 Encounter for screening for lipoid disorders: Secondary | ICD-10-CM

## 2019-04-15 DIAGNOSIS — Z0000 Encounter for general adult medical examination without abnormal findings: Secondary | ICD-10-CM

## 2019-04-15 DIAGNOSIS — F419 Anxiety disorder, unspecified: Secondary | ICD-10-CM

## 2019-04-15 DIAGNOSIS — Z9109 Other allergy status, other than to drugs and biological substances: Secondary | ICD-10-CM

## 2019-04-15 DIAGNOSIS — K5909 Other constipation: Secondary | ICD-10-CM | POA: Insufficient documentation

## 2019-04-15 DIAGNOSIS — R7303 Prediabetes: Secondary | ICD-10-CM

## 2019-04-15 DIAGNOSIS — F319 Bipolar disorder, unspecified: Secondary | ICD-10-CM

## 2019-04-15 DIAGNOSIS — F32A Depression, unspecified: Secondary | ICD-10-CM | POA: Insufficient documentation

## 2019-04-15 DIAGNOSIS — G43709 Chronic migraine without aura, not intractable, without status migrainosus: Secondary | ICD-10-CM

## 2019-04-15 DIAGNOSIS — F329 Major depressive disorder, single episode, unspecified: Secondary | ICD-10-CM

## 2019-04-15 DIAGNOSIS — F900 Attention-deficit hyperactivity disorder, predominantly inattentive type: Secondary | ICD-10-CM

## 2019-04-15 DIAGNOSIS — IMO0002 Reserved for concepts with insufficient information to code with codable children: Secondary | ICD-10-CM | POA: Insufficient documentation

## 2019-04-15 DIAGNOSIS — Z23 Encounter for immunization: Secondary | ICD-10-CM | POA: Diagnosis not present

## 2019-04-15 NOTE — Progress Notes (Signed)
Subjective:    Patient ID: Caroline Bolton, female    DOB: [birth year], 28 y.o.   MRN: [id number]  HPI Chief Complaint  Patient presents with  . new pt    new pt, cpe, last pap was normal in 2019. wants to come off metformin since losing weight- was on it due to insulin being high but not diabetic.    She is new to the practice and here for a complete physical exam. Previous medical care: moved here from [facility]. [facility]   Other providers: Dr. Marin Comment- psychiatrist in [facility]. Needs a new one here   History of anxiety, depression, Bipolar, ADHD.  Per chart review, she saw Dr. Dwyane Dee at [facility] in 2014 before moving to [facility]   States she lost 24 lbs over 6 weeks by exercising and lifting weights.   Reports history of prediabetes- has been taking Metformin 500 mg since 09/2018. She would like to stop taking it.   Migraine headaches- on Lamictal for prevention and has been for the past 1 1/2 years. Does not need Maxalt often.   Takes Amitiza for constipation.  Takes Allegra for allergies- year round.   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 Heavy on carbs  Excerise: most days   Immunizations: not on file   Health maintenance:  Mammogram: N/A Colonoscopy: N/A Last Gynecological Exam: 09/2018  Last Menstrual cycle: June. Has cycle every 3 months due to OCPs.  Pregnancies: 1. Abortion in 03/2012 Last Dental Exam: years ago  Last Eye Exam: October 2018 and has contact lenses   Wears seatbelt always,  smoke detectors in home and functioning, does not text while driving and feels safe in home environment.   Reviewed allergies, medications, past medical, surgical, family, and social history.     Review of Systems Review of Systems Constitutional: -fever, -chills, -sweats,  -unexpected weight change,-fatigue ENT: -runny nose, -ear pain, -sore throat Cardiology:  -chest pain, -palpitations, -edema Respiratory: -cough, -shortness of breath, -wheezing Gastroenterology: -abdominal pain, -nausea, -vomiting, -diarrhea, -constipation  Hematology: -bleeding or bruising problems Musculoskeletal: -arthralgias, -myalgias, -joint swelling, -back pain Ophthalmology: -vision changes Urology: -dysuria, -difficulty urinating, -hematuria, -urinary frequency, -urgency Neurology: -headache, -weakness, -tingling, -numbness       Objective:   Physical Exam BP 122/72   Pulse 77   Temp 98.2 F (36.8 C)   Ht 5' 7.5" (1.715 m)   Wt 204 lb 9.6 oz (92.8 kg)   LMP  (Within Months) Comment: august- every 3 months  BMI 31.57 kg/m   General Appearance:    Alert, cooperative, no distress, appears stated age  Head:    Normocephalic, without obvious abnormality, atraumatic  Eyes:    PERRL, conjunctiva/corneas clear, EOM's intact, fundi    benign  Ears:    Normal TM's and external ear canals  Nose:   Mask in place   Throat:   Mask in place   Neck:   Supple, no lymphadenopathy;  thyroid:  no   enlargement/tenderness/nodules; no carotid   bruit or JVD  Back:    Spine nontender, no curvature, ROM normal, no CVA     tenderness  Lungs:     Clear to auscultation bilaterally without wheezes, rales or     ronchi; respirations unlabored  Chest Wall:    No tenderness or deformity  Heart:    Regular rate and rhythm, S1 and S2 normal, no murmur, rub   or gallop  Breast Exam:    Declines. Done recently per patient  Abdomen:     Soft, non-tender, nondistended, normoactive bowel sounds,    no masses, no hepatosplenomegaly  Genitalia:    Declines. Normal pap smear in 09/2018   Rectal:    Not performed due to age<40 and no related complaints  Extremities:   No clubbing, cyanosis or edema  Pulses:   2+ and symmetric all extremities  Skin:   Skin color, texture, turgor normal, no rashes or  lesions  Lymph nodes:   Cervical, supraclavicular, and axillary nodes normal  Neurologic:   CNII-XII intact, normal strength, sensation and gait; reflexes 2+ and symmetric throughout          Psych:   Normal mood, affect, hygiene and grooming.        Assessment & Plan:  Routine general medical examination at a health care facility - Plan: CBC with Differential/Platelet, Comprehensive metabolic panel, TSH, T4, free -Here today for new patient physical.  Discussed preventive healthcare.  She is up-to-date on Pap smear.  Declines STD testing.  States she had this recently.  Discussed safety and health promotion.  Counseling on healthy lifestyle with diet and exercise. Encouraged her to avoid alcohol with her current medications.  Discussed immunizations however I do not have her previous records and I will request these.  She has forms for me to fill out for [employer].  Once I have the immunization history I can complete these  Chronic constipation-well-controlled on Amitiza  Anxiety and depression- a list of psychiatrists was provided.  She will call and schedule an appointment.  She is not in any acute danger and no thoughts of self-harm.  Bipolar affective disorder, remission status unspecified (HCC)- She will call and schedule an appointment with a psychiatrist.  She is not in any acute danger and no thoughts of self-harm.  ADHD (attention deficit hyperactivity disorder), inattentive type-She will call and schedule an appointment with a psychiatrist.    Environmental allergies- continue on Allegra.   Screening for lipid disorders - Plan: Lipid panel  Prediabetes - Plan: Hemoglobin A1c  Need for influenza vaccination - Plan: Flu Vaccine QUAD 36+ mos IM  Chronic migraine- seems to be controlled with Lamictal.

## 2019-04-15 NOTE — Patient Instructions (Signed)
You can call to schedule your appointment with the psychiatrist/counselor. A few offices are listed below for you to call.    [place] for a psychiatrist  [place]  (across from [facility])  [place] [facility] [address] [place], [place] [address] [place] [address], [place], [place] [address]  Phone: [phone]   [place], [place] [address]  Phone: [phone]   Preventive Care 77-30 Years Old, Female Preventive care refers to visits with your health care provider and lifestyle choices that can promote health and wellness. This includes:  A yearly physical exam. This may also be called an annual well check.  Regular dental visits and eye exams.  Immunizations.  Screening for certain conditions.  Healthy lifestyle choices, such as eating a healthy diet, getting regular exercise, not using drugs or products that contain nicotine and tobacco, and limiting alcohol use. What can I expect for my preventive care visit? Physical exam Your health care provider will check your:  Height and weight. This may be used to calculate body mass index (BMI), which tells if you are at a healthy weight.  Heart rate and blood pressure.  Skin for abnormal spots. Counseling Your health care provider may ask you questions about your:  Alcohol, tobacco, and drug use.  Emotional well-being.  Home and relationship well-being.  Sexual activity.  Eating habits.  Work and work Statistician.  Method of birth control.  Menstrual cycle.  Pregnancy history. What immunizations do I need?  Influenza (flu) vaccine  This is recommended every year. Tetanus, diphtheria, and pertussis (Tdap) vaccine  You may need a Td booster every 10 years. Varicella (chickenpox)  vaccine  You may need this if you have not been vaccinated. Human papillomavirus (HPV) vaccine  If recommended by your health care provider, you may need three doses over 6 months. Measles, mumps, and rubella (MMR) vaccine  You may need at least one dose of MMR. You may also need a second dose. Meningococcal conjugate (MenACWY) vaccine  One dose is recommended if you are age 30-21 years and a first-year college student living in a residence hall, or if you have one of several medical conditions. You may also need additional booster doses. Pneumococcal conjugate (PCV13) vaccine  You may need this if you have certain conditions and were not previously vaccinated. Pneumococcal polysaccharide (PPSV23) vaccine  You may need one or two doses if you smoke cigarettes or if you have certain conditions. Hepatitis A vaccine  You may need this if you have certain conditions or if you travel or work in places where you may be exposed to hepatitis A. Hepatitis B vaccine  You may need this if you have certain conditions or if you travel or work in places where you may be exposed to hepatitis B. Haemophilus influenzae type b (Hib) vaccine  You may need this if you have certain conditions. You may receive vaccines as individual doses or as more than one vaccine together in one shot (combination vaccines). Talk with your health care provider about the risks and benefits of combination vaccines. What tests do I need?  Blood tests  Lipid and cholesterol levels. These may be checked every 5 years starting at age 30.  Hepatitis C test.  Hepatitis B test. Screening  Diabetes screening.  This is done by checking your blood sugar (glucose) after you have not eaten for a while (fasting).  Sexually transmitted disease (STD) testing.  BRCA-related cancer screening. This may be done if you have a family history of breast, ovarian, tubal, or peritoneal cancers.  Pelvic exam and Pap test. This may be  done every 3 years starting at age 30. Starting at age 30, this may be done every 5 years if you have a Pap test in combination with an HPV test. Talk with your health care provider about your test results, treatment options, and if necessary, the need for more tests. Follow these instructions at home: Eating and drinking   Eat a diet that includes fresh fruits and vegetables, whole grains, lean protein, and low-fat dairy.  Take vitamin and mineral supplements as recommended by your health care provider.  Do not drink alcohol if: ? Your health care provider tells you not to drink. ? You are pregnant, may be pregnant, or are planning to become pregnant.  If you drink alcohol: ? Limit how much you have to 0-1 drink a day. ? Be aware of how much alcohol is in your drink. In the [place] one drink equals one 12 oz bottle of beer (355 mL), one 5 oz glass of wine (148 mL), or one 1 oz glass of hard liquor (44 mL). Lifestyle  Take daily care of your teeth and gums.  Stay active. Exercise for at least 30 minutes on 5 or more days each week.  Do not use any products that contain nicotine or tobacco, such as cigarettes, e-cigarettes, and chewing tobacco. If you need help quitting, ask your health care provider.  If you are sexually active, practice safe sex. Use a condom or other form of birth control (contraception) in order to prevent pregnancy and STIs (sexually transmitted infections). If you plan to become pregnant, see your health care provider for a preconception visit. What's next?  Visit your health care provider once a year for a well check visit.  Ask your health care provider how often you should have your eyes and teeth checked.  Stay up to date on all vaccines. This information is not intended to replace advice given to you by your health care provider. Make sure you discuss any questions you have with your health care provider. Document Released: 09/17/2001 Document Revised:  04/02/2018 Document Reviewed: 04/02/2018 Elsevier Patient Education  [employer].

## 2019-04-16 LAB — CBC WITH DIFFERENTIAL/PLATELET
Basophils Absolute: 0 10*3/uL (ref 0.0–0.2)
Basos: 1 %
EOS (ABSOLUTE): 0.1 10*3/uL (ref 0.0–0.4)
Eos: 2 %
Hematocrit: 42.8 % (ref 34.0–46.6)
Hemoglobin: 13.9 g/dL (ref 11.1–15.9)
Immature Grans (Abs): 0 10*3/uL (ref 0.0–0.1)
Immature Granulocytes: 0 %
Lymphocytes Absolute: 2.6 10*3/uL (ref 0.7–3.1)
Lymphs: 45 %
MCH: 24.1 pg — ABNORMAL LOW (ref 26.6–33.0)
MCHC: 32.5 g/dL (ref 31.5–35.7)
MCV: 74 fL — ABNORMAL LOW (ref 79–97)
Monocytes Absolute: 0.4 10*3/uL (ref 0.1–0.9)
Monocytes: 6 %
Neutrophils Absolute: 2.6 10*3/uL (ref 1.4–7.0)
Neutrophils: 46 %
Platelets: 327 10*3/uL (ref 150–450)
RBC: 5.76 x10E6/uL — ABNORMAL HIGH (ref 3.77–5.28)
RDW: 15 % (ref 11.7–15.4)
WBC: 5.7 10*3/uL (ref 3.4–10.8)

## 2019-04-16 LAB — COMPREHENSIVE METABOLIC PANEL
ALT: 41 IU/L — ABNORMAL HIGH (ref 0–32)
AST: 23 IU/L (ref 0–40)
Albumin/Globulin Ratio: 1.6 (ref 1.2–2.2)
Albumin: 4.4 g/dL (ref 3.9–5.0)
Alkaline Phosphatase: 54 IU/L (ref 39–117)
BUN/Creatinine Ratio: 5 — ABNORMAL LOW (ref 9–23)
BUN: 6 mg/dL (ref 6–20)
Bilirubin Total: 0.4 mg/dL (ref 0.0–1.2)
CO2: 23 mmol/L (ref 20–29)
Calcium: 9.8 mg/dL (ref 8.7–10.2)
Chloride: 100 mmol/L (ref 96–106)
Creatinine, Ser: 1.19 mg/dL — ABNORMAL HIGH (ref 0.57–1.00)
GFR calc Af Amer: 71 mL/min/{1.73_m2} (ref >59)
GFR calc non Af Amer: 62 mL/min/{1.73_m2} (ref >59)
Globulin, Total: 2.8 g/dL (ref 1.5–4.5)
Glucose: 81 mg/dL (ref 65–99)
Potassium: 4.4 mmol/L (ref 3.5–5.2)
Sodium: 135 mmol/L (ref 134–144)
Total Protein: 7.2 g/dL (ref 6.0–8.5)

## 2019-04-16 LAB — LIPID PANEL
Chol/HDL Ratio: 3.9 ratio (ref 0.0–4.4)
Cholesterol, Total: 202 mg/dL — ABNORMAL HIGH (ref 100–199)
HDL: 52 mg/dL (ref >39)
LDL Chol Calc (NIH): 140 mg/dL — ABNORMAL HIGH (ref 0–99)
Triglycerides: 57 mg/dL (ref 0–149)
VLDL Cholesterol Cal: 10 mg/dL (ref 5–40)

## 2019-04-16 LAB — T4, FREE: Free T4: 1.4 ng/dL (ref 0.82–1.77)

## 2019-04-16 LAB — HEMOGLOBIN A1C
Est. average glucose Bld gHb Est-mCnc: 97 mg/dL
Hgb A1c MFr Bld: 5 % (ref 4.8–5.6)

## 2019-04-16 LAB — TSH: TSH: 2.38 u[IU]/mL (ref 0.450–4.500)

## 2019-05-19 ENCOUNTER — Ambulatory Visit: Payer: BC Managed Care – PPO | Admitting: Family Medicine

## 2019-05-19 ENCOUNTER — Encounter: Payer: Self-pay | Admitting: Family Medicine

## 2019-05-19 ENCOUNTER — Other Ambulatory Visit: Payer: Self-pay

## 2019-05-19 VITALS — BP 122/82 | HR 82 | Wt 210.8 lb

## 2019-05-19 DIAGNOSIS — E7800 Pure hypercholesterolemia, unspecified: Secondary | ICD-10-CM | POA: Insufficient documentation

## 2019-05-19 DIAGNOSIS — R7989 Other specified abnormal findings of blood chemistry: Secondary | ICD-10-CM | POA: Insufficient documentation

## 2019-05-19 DIAGNOSIS — R350 Frequency of micturition: Secondary | ICD-10-CM

## 2019-05-19 DIAGNOSIS — R7401 Elevation of levels of liver transaminase levels: Secondary | ICD-10-CM | POA: Insufficient documentation

## 2019-05-19 LAB — POCT URINALYSIS DIP (PROADVANTAGE DEVICE)
Bilirubin, UA: NEGATIVE
Blood, UA: NEGATIVE
Glucose, UA: NEGATIVE mg/dL
Ketones, POC UA: NEGATIVE mg/dL
Leukocytes, UA: NEGATIVE
Nitrite, UA: NEGATIVE
Protein Ur, POC: NEGATIVE mg/dL
Specific Gravity, Urine: 1.015
Urobilinogen, Ur: NEGATIVE
pH, UA: 6 (ref 5.0–8.0)

## 2019-05-19 NOTE — Patient Instructions (Signed)
Obgyn Offices:   [facility] [address] [place], [place] [address] [phone]  [facility] Address: [address] [place], [place] [address] Phone: [phone]  [facility] [address] [place], [place] [address] Phone: [phone]   [facility] [address] , [place] [address] Phone: [phone] 

## 2019-05-19 NOTE — Progress Notes (Signed)
   Subjective:    Patient ID: Caroline Bolton, female    DOB: Aug 19, 1990, 28 y.o.   MRN: [id number]  HPI Chief Complaint  Patient presents with  . follow-up on labs    follow-up on labs. urination urgency , BC and migraine med has an interaction with each other   She is here to follow-up on abnormal labs. Previously she was on metformin due to having prediabetes. Her hemoglobin A1c was 5.0% so she stopped the Metformin   LDL is elevated and her diet does appear to be high and fat and cholesterol.  She does not exercise.  Her ALT was also elevated.states she had alcohol the day before her visit and lab draw.  States she only takes NSAIDs 2-3 days per month approximately.  Serum creatinine was mildly elevated and she does have a history of the same in 2014.  She was not aware of this.  Reports having urinary frequency and urgency recently.  This may be due to increased water intake.  No dysuria.  Reports urinating 7-8 times during the day. Once at night.   States her birth control interferes with her Lamictal so she stopped the Lamictal because she is concerned more about pregnancy.  Taking Lamictal for migraines. She is interested in other forms of birth control.  Does not have an OB/GYN.  Denies fever, chills, abdominal pain, back pain nausea, vomiting.    Review of Systems Pertinent positives and negatives in the history of present illness.     Objective:   Physical Exam BP 122/82   Pulse 82   Wt 210 lb 12.8 oz (95.6 kg)   BMI 32.53 kg/m   Alert and oriented and in no acute distress.  Not otherwise examined.     Assessment & Plan:  Elevated serum creatinine - Plan: Comprehensive metabolic panel -Discussed importance of staying well-hydrated.  She does not have hypertension or diabetes.  No significant family history of renal disease.  We will recheck CMP and follow-up  Elevated ALT measurement - Plan: Comprehensive metabolic panel, Hepatitis panel, acute -States she  had alcohol the day before her last blood draw.  Denies having alcohol in the past couple of days.  Also check an acute hepatitis panel.  Elevated LDL cholesterol level-counseling done on healthy lifestyle including diet and exercise to help control cholesterol.  Urinary frequency - Plan: POCT Urinalysis DIP (Proadvantage Device) -UA negative

## 2019-05-20 ENCOUNTER — Ambulatory Visit: Payer: BC Managed Care – PPO | Admitting: Family Medicine

## 2019-05-20 LAB — COMPREHENSIVE METABOLIC PANEL
ALT: 39 IU/L — ABNORMAL HIGH (ref 0–32)
AST: 20 IU/L (ref 0–40)
Albumin/Globulin Ratio: 1.4 (ref 1.2–2.2)
Albumin: 4.2 g/dL (ref 3.9–5.0)
Alkaline Phosphatase: 64 IU/L (ref 39–117)
BUN/Creatinine Ratio: 6 — ABNORMAL LOW (ref 9–23)
BUN: 7 mg/dL (ref 6–20)
Bilirubin Total: 0.3 mg/dL (ref 0.0–1.2)
CO2: 21 mmol/L (ref 20–29)
Calcium: 9.3 mg/dL (ref 8.7–10.2)
Chloride: 104 mmol/L (ref 96–106)
Creatinine, Ser: 1.15 mg/dL — ABNORMAL HIGH (ref 0.57–1.00)
GFR calc Af Amer: 74 mL/min/{1.73_m2} (ref >59)
GFR calc non Af Amer: 64 mL/min/{1.73_m2} (ref >59)
Globulin, Total: 3.1 g/dL (ref 1.5–4.5)
Glucose: 83 mg/dL (ref 65–99)
Potassium: 4.2 mmol/L (ref 3.5–5.2)
Sodium: 138 mmol/L (ref 134–144)
Total Protein: 7.3 g/dL (ref 6.0–8.5)

## 2019-05-20 LAB — HEPATITIS PANEL, ACUTE
Hep A IgM: NEGATIVE
Hep B C IgM: NEGATIVE
Hep C Virus Ab: <0.1 s/co ratio (ref 0.0–0.9)
Hepatitis B Surface Ag: NEGATIVE

## 2019-06-01 ENCOUNTER — Encounter: Payer: Self-pay | Admitting: Family Medicine

## 2019-06-23 ENCOUNTER — Other Ambulatory Visit: Payer: Self-pay

## 2019-06-23 ENCOUNTER — Ambulatory Visit: Payer: BC Managed Care – PPO | Admitting: Family Medicine

## 2019-06-23 ENCOUNTER — Encounter: Payer: Self-pay | Admitting: Family Medicine

## 2019-06-23 VITALS — BP 120/82 | HR 71 | Temp 97.8°F | Wt 211.0 lb

## 2019-06-23 DIAGNOSIS — G43709 Chronic migraine without aura, not intractable, without status migrainosus: Secondary | ICD-10-CM

## 2019-06-23 DIAGNOSIS — R7401 Elevation of levels of liver transaminase levels: Secondary | ICD-10-CM

## 2019-06-23 DIAGNOSIS — IMO0002 Reserved for concepts with insufficient information to code with codable children: Secondary | ICD-10-CM

## 2019-06-23 DIAGNOSIS — R7989 Other specified abnormal findings of blood chemistry: Secondary | ICD-10-CM

## 2019-06-23 NOTE — Progress Notes (Signed)
   Subjective:    Patient ID: Caroline Bolton, female    DOB: [birth year], 28 y.o.   MRN: [id number]  HPI Chief Complaint  Patient presents with  . follow-up    follow-up on liver and kidney. having more migraines due to med and wants referral to neurology    She is fairly new to me.  Here today to follow up on abnormal labs.  Mildly elevated creatinine and ALT.  Denies any alcohol or NSAIDs for the past 4 weeks.   LMP: 2 months ago   Chronic migraine headaches- she was taking Lamictal but stopped because it interfered with her birth control.  She took this for approximately 4 years. States she only had 2 migraine headaches per month while on Lamictal. States since stopping Lamictal because she was told it would interfere with her birth control, she has been having 3 headaches per week. States her birth control has been more important than headaches but now she needs help with controlling headaches.  States she took Topamax in the past and cannot recall why she was switched to Lamictal.  Requests referral to neurology. States she has never seen neurology for her migraine headaches.   I encouraged her to see OB/GYN to discuss nonhormonal IUD but she has not scheduled with anyone yet.  Currently she is taking OCPs continuously   Denies fever, chills, dizziness, chest pain, palpitations, shortness of breath, abdominal pain, N/V/D, urinary symptoms, LE edema.    Review of Systems Pertinent positives and negatives in the history of present illness.     Objective:   Physical Exam BP 120/82   Pulse 71   Temp 97.8 F (36.6 C)   Wt 211 lb (95.7 kg)   BMI 32.56 kg/m   Alert and oriented and in no distress. Not otherwise examined today.      Assessment & Plan:  Chronic migraine - Plan: Ambulatory referral to Neurology  Elevated ALT measurement - Plan: Comprehensive metabolic panel  Elevated serum creatinine - Plan: Comprehensive metabolic panel  Complex history of migraine  headaches. Previously well controlled on Lamictal per patient.  Prefers to not start back on Lamictal. Pregnancy prevention is her main concern. Recommend scheduling with OB/GYN. Considered starting her on Topamax as she has taken this in the past. I will refer her to neurology for further evaluation and management of chronic migraines.  Follow up pending lab results.

## 2019-06-24 ENCOUNTER — Ambulatory Visit: Payer: BC Managed Care – PPO | Admitting: Family Medicine

## 2019-06-24 ENCOUNTER — Encounter: Payer: Self-pay | Admitting: Family Medicine

## 2019-06-24 ENCOUNTER — Other Ambulatory Visit: Payer: Self-pay

## 2019-06-24 VITALS — Temp 98.0°F

## 2019-06-24 DIAGNOSIS — J3489 Other specified disorders of nose and nasal sinuses: Secondary | ICD-10-CM

## 2019-06-24 DIAGNOSIS — R0981 Nasal congestion: Secondary | ICD-10-CM | POA: Diagnosis not present

## 2019-06-24 DIAGNOSIS — R5383 Other fatigue: Secondary | ICD-10-CM

## 2019-06-24 DIAGNOSIS — Z20822 Contact with and (suspected) exposure to covid-19: Secondary | ICD-10-CM

## 2019-06-24 LAB — COMPREHENSIVE METABOLIC PANEL
ALT: 29 IU/L (ref 0–32)
AST: 23 IU/L (ref 0–40)
Albumin/Globulin Ratio: 1.4 (ref 1.2–2.2)
Albumin: 4.4 g/dL (ref 3.9–5.0)
Alkaline Phosphatase: 65 IU/L (ref 39–117)
BUN/Creatinine Ratio: 5 — ABNORMAL LOW (ref 9–23)
BUN: 6 mg/dL (ref 6–20)
Bilirubin Total: 0.4 mg/dL (ref 0.0–1.2)
CO2: 23 mmol/L (ref 20–29)
Calcium: 10.1 mg/dL (ref 8.7–10.2)
Chloride: 98 mmol/L (ref 96–106)
Creatinine, Ser: 1.26 mg/dL — ABNORMAL HIGH (ref 0.57–1.00)
GFR calc Af Amer: 66 mL/min/{1.73_m2} (ref >59)
GFR calc non Af Amer: 57 mL/min/{1.73_m2} — ABNORMAL LOW (ref >59)
Globulin, Total: 3.2 g/dL (ref 1.5–4.5)
Glucose: 96 mg/dL (ref 65–99)
Potassium: 4.8 mmol/L (ref 3.5–5.2)
Sodium: 136 mmol/L (ref 134–144)
Total Protein: 7.6 g/dL (ref 6.0–8.5)

## 2019-06-24 NOTE — Progress Notes (Signed)
   Subjective:  Documentation for virtual audio and video telecommunications through [employer] encounter:  The patient was located at home. 2 patient identifiers used.  The provider was located in the office. The patient did consent to this visit and is aware of possible charges through their insurance for this visit.  The other persons participating in this telemedicine service were none.    Patient ID: Caroline Bolton, female    DOB: 09/27/1990, 28 y.o.   MRN: [id number]  HPI Chief Complaint  Patient presents with  . congestion    congestion and runny nose since 5pm yesterday but throughtout day has gotten better.  taken homemade cural- cinnamon alcohol and whiskey for decongested.    Complains of severe nasal congestion, pressure in sinuses, rhinorrhea, sneezing since 5 pm yesterday. States she has fatigue as well.  States she felt very hot last night but has not had a fever. States she drank some whiskey last night and a homemade remedy.   States she feels 80% better today.   No fever, chills, sore throat, cough, shortness of breath, abdominal pain, nausea, vomiting, diarrhea.  Review of Systems Pertinent positives and negatives in the history of present illness.     Objective:   Physical Exam Temp 98 F (36.7 C)   Alert and oriented and in no acute distress.  Respirations unlabored.  She does sound nasally congested.      Assessment & Plan:  Nasal congestion  Rhinorrhea  Fatigue, unspecified type  Discussed limitations of a virtual visit. Acute onset of URI symptoms and fatigue yesterday.  She does work in the school system.  She was seen in my office yesterday for a different issue.  Reports she did not have the symptoms until 5 PM. She will go get a COVID-19 test at the [facility].  Continue with symptomatic treatment and follow-up as needed.  Advised her to quarantine until her results are back.  If her test result was negative and her symptoms have resolved  and she is fine to go back to work.  If her symptoms worsen then she should not return to work until 10 days from onset per CDC guidelines.  Time spent on call was 12 minutes and in review of previous records 15 minutes total.  This virtual service is not related to other E/M service within previous 7 days.

## 2019-06-27 LAB — NOVEL CORONAVIRUS, NAA: SARS-CoV-2, NAA: NOT DETECTED

## 2019-06-29 ENCOUNTER — Telehealth: Payer: Self-pay | Admitting: Family Medicine

## 2019-06-29 NOTE — Telephone Encounter (Signed)
Records received from [facility]

## 2019-07-05 ENCOUNTER — Telehealth: Payer: Self-pay

## 2019-07-05 ENCOUNTER — Encounter: Payer: Self-pay | Admitting: Internal Medicine

## 2019-07-05 NOTE — Telephone Encounter (Signed)
Please assist her with this out of work note. Thanks. She was tested and negative for Covid-19 infection.

## 2019-07-05 NOTE — Telephone Encounter (Signed)
Left message that pt can get this off her mychart under letter to print and give to [facility]

## 2019-07-05 NOTE — Telephone Encounter (Signed)
Pt. Called said she was seen here on 06/24/19 and was told she could get a note for work and she was needing the note asap to give to her boss, she was out 11/19, 11/20, 11/23, 11/24.

## 2019-07-20 ENCOUNTER — Encounter: Payer: Self-pay | Admitting: Neurology

## 2019-07-20 NOTE — Progress Notes (Signed)
Virtual Visit via Video Note The purpose of this virtual visit is to provide medical care while limiting exposure to the novel coronavirus.    Consent was obtained for video visit:  Yes.   Answered questions that patient had about telehealth interaction:  Yes.   I discussed the limitations, risks, security and privacy concerns of performing an evaluation and management service by telemedicine. I also discussed with the patient that there may be a patient responsible charge related to this service. The patient expressed understanding and agreed to proceed.  Pt location: Home Physician Location: Home Name of referring provider:  Avanell Shackleton, NP-C I connected with Rito Ehrlich at patients initiation/request on 07/21/2019 at  7:50 AM EST by video enabled telemedicine application and verified that I am speaking with the correct person using two identifiers. Pt MRN:  [id number] Pt DOB:  [birth year] Video Participants:  Rito Ehrlich   History of Present Illness:  Caroline Bolton is a 28 year old female who presents for migraines.  History supplemented by referring provider note.  Onset: since high school.  Controlled on lamotrigine but increased after discontinuing it 4 months ago due to interfering with her birth control Location:  Usually front/behind eyes, sometimes middle Quality:  Usually throbbing, sometimes pounding Intensity:  Severe.  She denies new headache, thunderclap headache or severe headache that wakes her from sleep. Aura:  Fragmented light (only occurs when exposed to bright light) Premonitory Phase:  Dull headache, neck tension Postdrome:  Photosensitivity  Associated symptoms:  Photophobia, phonophobia, nausea.  She denies associated vomiting, unilateral numbness or weakness. Duration:  Several hours.  Needs to repeat rizatriptan. Frequency:  3 to 4 times a week. Frequency of abortive medication: 3 to 4 times a week Triggers:  Chocolate or other sweets; dehydration;  skipped meals; anxiety Relieving factors:  none Activity:  aggravated  Rescue therapy:  Rizatriptan, then repeat with Coke; sometimes ibuprofen/Tylenol/Coke helps Current NSAIDS:  none Current analgesics:  Tylenol; Excedrin Migraine Current triptans:  Maxalt 10mg  Current ergotamine:  none Current anti-emetic:  none Current muscle relaxants:  none Current anti-anxiolytic:  clonazepam Current sleep aide:  Ambien Current Antihypertensive medications:  none Current Antidepressant/mood stabilizing medications:  Vyvanse; Abilify Current Anticonvulsant medications:  none Current anti-CGRP:  none Current Vitamins/Herbal/Supplements:  none Current Antihistamines/Decongestants:  Allegra Other therapy:  none Hormone/birth control:  [name]  Past NSAIDS:  Ibuprofen, naproxen Past analgesics:  none Past abortive triptans:  none Past abortive ergotamine:  none Past muscle relaxants:  none Past anti-emetic:  none Past antihypertensive medications:  none Past antidepressant medications:  Prozac; trazodone Past anticonvulsant medications:  Lamotrigine 50mg  daily (effective but discontinued due to interference with her birth control); topiramate 50mg  twice daily; Depakote 250mg  twice daily Past anti-CGRP:  none Past vitamins/Herbal/Supplements:  C; E Past antihistamines/decongestants:  none Other past therapies:  meditation  Caffeine:  16 oz coffee Monday through Friday; Coke only with migraines Alcohol:  rarely Diet:  200 mL water daily; tries not to skip meals.  May skip lunch Exercise:  Not routine Depression:  yes; Anxiety:  varies Other pain:  none Sleep hygiene:  Usually good. Family history of headache:  Mom (used to have migraines)  It has been noted that she has had a mildly elevated Cr (1.26 3 weeks ago, 1.15 2 months ago, and 1.19 3 months ago).  ALT was mildly elevated at 39 2 months ago and 41 3 months ago, but was 29 3 weeks ago.  Otherwise, CMP has been unremarkable.  She  was taken off of NSAIDs for a time but was aloud to resume.   Past Medical History: Past Medical History:  Diagnosis Date  . Anxiety   . Depression   . Environmental allergies   . Migraines     Medications: Outpatient Encounter Medications as of 07/21/2019  Medication Sig  . AMITIZA 24 MCG capsule Take 24 mcg by mouth 2 (two) times daily.  . ARIPiprazole (ABILIFY) 2 MG tablet Take 2 mg by mouth at bedtime.  . ARIPiprazole (ABILIFY) 5 MG tablet Take 5 mg by mouth at bedtime.  . clonazePAM (KLONOPIN) 1 MG tablet Take 1 mg by mouth 3 (three) times daily as needed.   Marland Kitchen Fexofenadine HCl (ALLEGRA PO) Take by mouth.  . lamoTRIgine (LAMICTAL) 25 MG tablet Take 25 mg by mouth daily.  . Levonorgestrel-Ethinyl Estradiol ([name]) 0.15-0.03 &0.01 MG tablet Take 1 tablet by mouth daily.  . rizatriptan (MAXALT) 10 MG tablet Take 10 mg by mouth as needed for migraine. For migraines. May repeat in 2 hours if needed  . VYVANSE 20 MG capsule TAKE 1 CAPSULE BY MOUTH ONCE DAILY AT NOON  . VYVANSE 50 MG capsule TAKE 1 CAPSULE BY MOUTH ONCE DAILY IN THE MORNING  . zolpidem (AMBIEN CR) 12.5 MG CR tablet Take 1 tablet by mouth at bedtime.   No facility-administered encounter medications on file as of 07/21/2019.    Allergies: Allergies  Allergen Reactions  . Sulfa Antibiotics Anaphylaxis, Hives and Swelling  . Pollen Extract     Family History: Family History  Problem Relation Age of Onset  . Hypertension Mother   . Depression Father   . Asthma Sister   . Colon cancer Maternal Uncle   . Diabetes Maternal Aunt     Social History: Social History   Socioeconomic History  . Marital status: Significant Other    Spouse name: Not on file  . Number of children: Not on file  . Years of education: Not on file  . Highest education level: Not on file  Occupational History  . Not on file  Tobacco Use  . Smoking status: Passive Smoke Exposure - Never Smoker  . Smokeless tobacco: Never Used  .  Tobacco comment: father but no longer lives with him   Substance and Sexual Activity  . Alcohol use: No  . Drug use: Not Currently    Types: Marijuana    Comment: THC daily but stopped 2 months ago   . Sexual activity: Yes    Birth control/protection: Pill  Other Topics Concern  . Not on file  Social History Narrative  . Not on file   Social Determinants of Health   Financial Resource Strain:   . Difficulty of Paying Living Expenses: Not on file  Food Insecurity:   . Worried About Charity fundraiser in the Last Year: Not on file  . Ran Out of Food in the Last Year: Not on file  Transportation Needs:   . Lack of Transportation (Medical): Not on file  . Lack of Transportation (Non-Medical): Not on file  Physical Activity:   . Days of Exercise per Week: Not on file  . Minutes of Exercise per Session: Not on file  Stress:   . Feeling of Stress : Not on file  Social Connections:   . Frequency of Communication with Friends and Family: Not on file  . Frequency of Social Gatherings with Friends and Family: Not on file  . Attends Religious Services: Not on file  .  Active Member of Clubs or Organizations: Not on file  . Attends BankerClub or Organization Meetings: Not on file  . Marital Status: Not on file  Intimate Partner Violence:   . Fear of Current or Ex-Partner: Not on file  . Emotionally Abused: Not on file  . Physically Abused: Not on file  . Sexually Abused: Not on file    Observations/Objective:   Height 5\' 8"  (1.727 m), weight 210 lb (95.3 kg). No acute distress.  Alert and oriented.  Speech fluent and not dysarthric.  Language intact.  Eyes orthophoric on primary gaze.  Face symmetric.  Assessment and Plan:   Chronic migraine without aura (sometimes with aura), without status migrainosus, not intractable  1.  For preventative management, start Aimovig 70mg  monthly 2.  For abortive therapy, stop rizatriptan and instead try sumatriptan 100mg  3.  Limit use of pain  relievers to no more than 2 days out of week to prevent risk of rebound or medication-overuse headache. 4.  Keep headache diary 5.  Exercise, hydration, caffeine cessation, sleep hygiene, monitor for and avoid triggers 6.  Consider:  magnesium citrate 400mg  daily, riboflavin 400mg  daily, and coenzyme Q10 100mg  three times daily 7. Follow up 4 months   Follow Up Instructions:    -I discussed the assessment and treatment plan with the patient. The patient was provided an opportunity to ask questions and all were answered. The patient agreed with the plan and demonstrated an understanding of the instructions.   The patient was advised to call back or seek an in-person evaluation if the symptoms worsen or if the condition fails to improve as anticipated.    Cira ServantAdam Robert Karrington Studnicka, DO

## 2019-07-21 ENCOUNTER — Telehealth (INDEPENDENT_AMBULATORY_CARE_PROVIDER_SITE_OTHER): Payer: BC Managed Care – PPO | Admitting: Neurology

## 2019-07-21 ENCOUNTER — Other Ambulatory Visit: Payer: Self-pay

## 2019-07-21 ENCOUNTER — Encounter: Payer: Self-pay | Admitting: Neurology

## 2019-07-21 VITALS — Ht 68.0 in | Wt 210.0 lb

## 2019-07-21 DIAGNOSIS — G43709 Chronic migraine without aura, not intractable, without status migrainosus: Secondary | ICD-10-CM | POA: Diagnosis not present

## 2019-07-21 DIAGNOSIS — G43109 Migraine with aura, not intractable, without status migrainosus: Secondary | ICD-10-CM

## 2019-07-21 MED ORDER — AIMOVIG 70 MG/ML ~~LOC~~ SOAJ: 70.0000 mg | SUBCUTANEOUS | 11 refills | Status: AC

## 2019-07-21 MED ORDER — SUMATRIPTAN SUCCINATE 100 MG PO TABS: ORAL_TABLET | ORAL | 3 refills | Status: DC

## 2019-07-21 NOTE — Patient Instructions (Signed)
1.  Start Aimovig 70mg  injection every 30 days 2.  Stop rizatriptan.  At earliest onset of migraine, take sumatriptan 100mg .  May repeat dose in 2 hours if needed.  Maximum 2 tablets in 24 hours 3.  Stop caffeine intake 4.  Limit use of pain relievers to no more than 2 days out of week to prevent risk of rebound or medication-overuse headache. 5.  Follow up in 4 months.

## 2019-07-23 ENCOUNTER — Telehealth: Payer: Self-pay | Admitting: Neurology

## 2019-07-23 NOTE — Telephone Encounter (Signed)
Patient states that Dr Tomi Likens sent in a RX for a injectable migraine medication (she did not know the name of the medication ) the pharmacy told her that her insurance would not cove it and it would need prior auth. She wants to know if we started the prior auth? Please call

## 2019-07-23 NOTE — Progress Notes (Signed)
(  Key: [id number]) Rx #: [id number] Aimovig 70MG /ML auto-injectors     Wait for Determination Please wait for [employer] to return a determination.

## 2019-07-23 NOTE — Telephone Encounter (Signed)
Patient notified that medication is pending in insurance

## 2019-07-26 NOTE — Progress Notes (Signed)
Sent to scan Date: 07/23/2019 ADAM JAFFE [address], [place], [place] [address] Plan Member Name: [place] SARINANA Plan Member ID:[id number] Prescriber Name: ADAM JAFFE Prescriber Phone: [phone] Prescriber Fax: [phone] Dear Danae Chen Wrage: [facility]  received a request from your provider for coverage of Aimovig 70MG /ML Kenmar SOAJ. As long as you remain covered by the [facility] and there are no changes to your plan benefits, this request is approved for the following time period: 07/23/2019 - 10/21/2019 Approvals may be subject to dosing limits in accordance with FDA approved labeling, evidence-based practice guidelines or your prescription drug plan benefits. If you have not already done so, you may ask your pharmacist to fill the prescription. If you have questions, please call Customer Care toll-free at the number on your [place] ID card toll-free at [phone]. Sincerely, [facility] cc: Dr. Quita Skye JAFFE PA# [place] [phone] Non-Grandfathered [id number] [facility]

## 2019-08-09 ENCOUNTER — Telehealth: Payer: Self-pay

## 2019-08-09 MED ORDER — LINACLOTIDE 145 MCG PO CAPS: 145.0000 ug | ORAL_CAPSULE | Freq: Every day | ORAL | 1 refills | Status: DC

## 2019-08-09 NOTE — Telephone Encounter (Signed)
Pt. Called stating that he insurance is not going to cover Amitiza but they will cover Linzess pt. Uses [facility] on [address]. If you could change that for her

## 2019-08-09 NOTE — Telephone Encounter (Signed)
Ok to switch her to [employer].

## 2019-08-09 NOTE — Telephone Encounter (Signed)
done

## 2019-08-09 NOTE — Telephone Encounter (Signed)
Linzess 145 mcg daily 30 minutes before breakfast. If she takes this with food, it can cause diarrhea and abdominal cramping.

## 2019-08-09 NOTE — Telephone Encounter (Signed)
What dose show she be on?

## 2019-08-16 ENCOUNTER — Telehealth: Payer: Self-pay | Admitting: Family Medicine

## 2019-08-16 NOTE — Telephone Encounter (Signed)
Ok to give her 90 days.

## 2019-08-16 NOTE — Telephone Encounter (Signed)
PT called and states she has a coupon for 90 days same price as 30 days for Linzess.  [employer] Can you give her 90 days.

## 2019-08-17 MED ORDER — LINACLOTIDE 145 MCG PO CAPS: 145.0000 ug | ORAL_CAPSULE | Freq: Every day | ORAL | 0 refills | Status: DC

## 2019-08-17 NOTE — Telephone Encounter (Signed)
done

## 2019-09-23 ENCOUNTER — Ambulatory Visit (INDEPENDENT_AMBULATORY_CARE_PROVIDER_SITE_OTHER): Payer: BC Managed Care – PPO | Admitting: Family Medicine

## 2019-09-23 ENCOUNTER — Encounter: Payer: Self-pay | Admitting: Family Medicine

## 2019-09-23 ENCOUNTER — Other Ambulatory Visit: Payer: Self-pay

## 2019-09-23 VITALS — Temp 98.1°F | Wt 206.0 lb

## 2019-09-23 DIAGNOSIS — R1111 Vomiting without nausea: Secondary | ICD-10-CM

## 2019-09-23 DIAGNOSIS — K5909 Other constipation: Secondary | ICD-10-CM
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
## 2019-09-28 ENCOUNTER — Telehealth: Payer: Self-pay | Admitting: Family Medicine

## 2019-09-28 DIAGNOSIS — K5909 Other constipation: Secondary | ICD-10-CM

## 2019-09-28 DIAGNOSIS — R1115 Cyclical vomiting syndrome unrelated to migraine: Secondary | ICD-10-CM

## 2019-09-28 DIAGNOSIS — R1111 Vomiting without nausea: Secondary | ICD-10-CM

## 2019-09-28 NOTE — Telephone Encounter (Signed)
I recommend taking over the counter Nexium 30-45 minutes before her evening meal and have her see GI please. Does she have one to call and schedule with or do we need to put in an urgent referral?

## 2019-09-28 NOTE — Telephone Encounter (Signed)
Pt called and said she has been eating a bland diet but has still been vomiting she said she has only been drinking liquids and is unsure on what to do next

## 2019-09-29 ENCOUNTER — Encounter: Payer: Self-pay | Admitting: Family Medicine

## 2019-09-29 DIAGNOSIS — R1115 Cyclical vomiting syndrome unrelated to migraine: Secondary | ICD-10-CM

## 2019-09-29 HISTORY — DX: Cyclical vomiting syndrome unrelated to migraine: R11.15

## 2019-09-29 NOTE — Telephone Encounter (Signed)
She wants a referral to GI. Please advise the reason ? Vommitting?

## 2019-09-29 NOTE — Telephone Encounter (Signed)
Cyclical vomiting and chronic constipation

## 2019-09-29 NOTE — Telephone Encounter (Signed)
Please call her and find out where she is requesting to be referred to GI and this needs to be urgent please. Thank you.

## 2019-09-29 NOTE — Telephone Encounter (Signed)
I have placed referral into [facility] and [facility]

## 2019-09-29 NOTE — Telephone Encounter (Signed)
Pt. Called back to let you know that she had some new symptoms to report, she has not had a bowel movement in 3 days and also her vomiting seems to be getting worse, I gave her the recommendations to take the nexium and I told her I would let you guys know that she needs a referral done asap ti a GI she said to the one on [address] Would be better because it is close for her.

## 2019-10-01 ENCOUNTER — Encounter: Payer: Self-pay | Admitting: Internal Medicine

## 2019-10-02 ENCOUNTER — Ambulatory Visit: Payer: BC Managed Care – PPO | Attending: Internal Medicine

## 2019-10-02 DIAGNOSIS — Z23 Encounter for immunization: Secondary | ICD-10-CM | POA: Insufficient documentation

## 2019-10-02 NOTE — Progress Notes (Signed)
   Covid-19 Vaccination Clinic  Name:  Accalia Rigdon    MRN: [id number] DOB: [birth year]  10/02/2019  Ms. Knarr was observed post Covid-19 immunization for 15 minutes without incidence. She was provided with Vaccine Information Sheet and instruction to access the V-Safe system.   Ms. Wyrick was instructed to call 911 with any severe reactions post vaccine: Marland Kitchen Difficulty breathing  . Swelling of your face and throat  . A fast heartbeat  . A bad rash all over your body  . Dizziness and weakness    Immunizations Administered    Name Date Dose VIS Date Route   Moderna COVID-19 Vaccine 10/02/2019  9:19 AM 0.5 mL 07/06/2019 Intramuscular   Manufacturer: Moderna   Lot: [id number]   NDC: 78375-423-70

## 2019-10-04 ENCOUNTER — Encounter: Payer: Self-pay | Admitting: Family Medicine

## 2019-10-06 ENCOUNTER — Other Ambulatory Visit: Payer: Self-pay | Admitting: Internal Medicine

## 2019-10-06 ENCOUNTER — Encounter: Payer: Self-pay | Admitting: Nurse Practitioner

## 2019-10-06 DIAGNOSIS — K5909 Other constipation: Secondary | ICD-10-CM

## 2019-10-06 DIAGNOSIS — R1115 Cyclical vomiting syndrome unrelated to migraine: Secondary | ICD-10-CM

## 2019-10-06 DIAGNOSIS — K921 Melena: Secondary | ICD-10-CM

## 2019-10-06 NOTE — Progress Notes (Addendum)
Pt called and states that she finally had a bowel movement after 5 days. She had blood in stool and underwear and then went back to [place] and had blood clot . Dr. Elnoria Howard will not be able to see pt after viewing records. I have put another referral in for [facility]  Yes, she will need to see GI for this as soon as she can be scheduled.

## 2019-10-07 ENCOUNTER — Telehealth: Payer: Self-pay | Admitting: Neurology

## 2019-10-07 NOTE — Telephone Encounter (Signed)
Please advise 

## 2019-10-07 NOTE — Telephone Encounter (Signed)
Patient wants to know if she got her Covid shot on Saturday is it ok to take the aimovig today please call

## 2019-10-07 NOTE — Telephone Encounter (Signed)
That is okay.  Just not to inject at the same site as the vaccine.

## 2019-10-07 NOTE — Telephone Encounter (Signed)
Patient notified

## 2019-10-13 ENCOUNTER — Other Ambulatory Visit (INDEPENDENT_AMBULATORY_CARE_PROVIDER_SITE_OTHER): Payer: BC Managed Care – PPO

## 2019-10-13 ENCOUNTER — Encounter: Payer: Self-pay | Admitting: Nurse Practitioner

## 2019-10-13 ENCOUNTER — Ambulatory Visit: Payer: BC Managed Care – PPO | Admitting: Nurse Practitioner

## 2019-10-13 VITALS — BP 112/74 | HR 68 | Temp 97.8°F | Ht 68.0 in | Wt 215.0 lb

## 2019-10-13 DIAGNOSIS — K625 Hemorrhage of anus and rectum: Secondary | ICD-10-CM

## 2019-10-13 DIAGNOSIS — K602 Anal fissure, unspecified: Secondary | ICD-10-CM

## 2019-10-13 DIAGNOSIS — K5909 Other constipation: Secondary | ICD-10-CM

## 2019-10-13 DIAGNOSIS — R197 Diarrhea, unspecified: Secondary | ICD-10-CM | POA: Diagnosis not present

## 2019-10-13 DIAGNOSIS — R112 Nausea with vomiting, unspecified: Secondary | ICD-10-CM

## 2019-10-13 DIAGNOSIS — Z01818 Encounter for other preprocedural examination: Secondary | ICD-10-CM

## 2019-10-13 LAB — CBC WITH DIFFERENTIAL/PLATELET
Basophils Absolute: 0.1 10*3/uL (ref 0.0–0.1)
Basophils Relative: 1.2 % (ref 0.0–3.0)
Eosinophils Absolute: 0.2 10*3/uL (ref 0.0–0.7)
Eosinophils Relative: 4.1 % (ref 0.0–5.0)
HCT: 40.7 % (ref 36.0–46.0)
Hemoglobin: 13.3 g/dL (ref 12.0–15.0)
Lymphocytes Relative: 34 % (ref 12.0–46.0)
Lymphs Abs: 1.7 10*3/uL (ref 0.7–4.0)
MCHC: 32.6 g/dL (ref 30.0–36.0)
MCV: 74.5 fl — ABNORMAL LOW (ref 78.0–100.0)
Monocytes Absolute: 0.3 10*3/uL (ref 0.1–1.0)
Monocytes Relative: 5.8 % (ref 3.0–12.0)
Neutro Abs: 2.7 10*3/uL (ref 1.4–7.7)
Neutrophils Relative %: 54.9 % (ref 43.0–77.0)
Platelets: 316 10*3/uL (ref 150.0–400.0)
RBC: 5.47 Mil/uL — ABNORMAL HIGH (ref 3.87–5.11)
RDW: 14.7 % (ref 11.5–15.5)
WBC: 5 10*3/uL (ref 4.0–10.5)

## 2019-10-13 LAB — COMPREHENSIVE METABOLIC PANEL
ALT: 27 U/L (ref 0–35)
AST: 18 U/L (ref 0–37)
Albumin: 4 g/dL (ref 3.5–5.2)
Alkaline Phosphatase: 51 U/L (ref 39–117)
BUN: 8 mg/dL (ref 6–23)
CO2: 24 mEq/L (ref 19–32)
Calcium: 9.2 mg/dL (ref 8.4–10.5)
Chloride: 105 mEq/L (ref 96–112)
Creatinine, Ser: 1.06 mg/dL (ref 0.40–1.20)
GFR: 73.46 mL/min (ref >60.00)
Glucose, Bld: 92 mg/dL (ref 70–99)
Potassium: 4.1 mEq/L (ref 3.5–5.1)
Sodium: 136 mEq/L (ref 135–145)
Total Bilirubin: 0.5 mg/dL (ref 0.2–1.2)
Total Protein: 7.6 g/dL (ref 6.0–8.3)

## 2019-10-13 LAB — TSH: TSH: 0.99 u[IU]/mL (ref 0.35–4.50)

## 2019-10-13 LAB — IGA: IgA: 221 mg/dL (ref 68–378)

## 2019-10-13 LAB — C-REACTIVE PROTEIN: CRP: 1.2 mg/dL (ref 0.5–20.0)

## 2019-10-13 MED ORDER — AMBULATORY NON FORMULARY MEDICATION: 0 refills | Status: AC

## 2019-10-13 NOTE — Patient Instructions (Addendum)
If you are age 31 or older, your body mass index should be between 23-30. Your Body mass index is 32.69 kg/m. If this is out of the aforementioned range listed, please consider follow up with your Primary Care Provider.  If you are age 49 or younger, your body mass index should be between 19-25. Your Body mass index is 32.69 kg/m. If this is out of the aformentioned range listed, please consider follow up with your Primary Care Providepreparation for the procedure.  [facility]'s information is below: Address: [address], [place], [place] [address]  [phone]  *Please DO NOT go directly from our office to pick up this medication! Give the pharmacy 1 day to process the prescription as this is compounded and takes time to make. Diltiazem gel  You have been scheduled for an abdominal ultrasound at [facility] (1st floor of hospital) on 10/15/2019 at 8:30. Please arrive 15 minutes prior to your appointment for registration. Make certain not to have anything to eat or drink 6 hours prior to your appointment. Should you need to reschedule your appointment, please contact radiology at [phone]. This test typically takes about 30 minutes to perform.  Your provider has requested that you go to the basement level for lab work before leaving today. Press "B" on the elevator. The lab is located at the first door on the left as you exit the elevator.  Please purchase the following medications over the counter and take as directed:  Miralax ducolac Phillips bacteria probiotic take 1 daily  Due to recent changes in healthcare laws, you may see the results of your imaging and laboratory studies on MyChart before your provider has had a chance to review them.  We understand that in some cases there may be results that are confusing or concerning to you. Not all laboratory results come back in the same time frame and the provider may be waiting for multiple results in order  to interpret others.  Please give [place] 48 hours in order for your provider to thoroughly review all the results before contacting the office for clarification of your results.   Thank you for choosing [facility] Arnaldo Natal, CRNP  Due to recent COVID-19 restrictions implemented by [facility] local and state authorities and in an effort to keep both patients and staff as safe as possible, [facility] requires COVID-19 testing prior to any scheduled endoscopic procedure. The testing center is located at [address] [place], [place] [address] in the [facility] [employer]  suite.  Your appointment has been scheduled for 10/21/2019 at 2:30 pm.   Please bring your insurance cards to this appointment. You will require your COVID screen 2 business days prior to your endoscopic procedure.  You are not required to quarantine after your screening.  You will only receive a phone call with the results if it is POSITIVE.  If you do not receive a call the day before your procedure you should begin your prep, if ordered, and you should report to the endo center for your procedure at your designated appointment arrival time ( one hour prior to the procedure time). There is no cost to you for the screening on the day of the swab.  [facility] will file with your insurance company for the testing.    You may receive an automated phone call prior to your procedure or have a message in your MyChart that you have an appointment for a  BP/15 at the [facility], please disregard this message.  Your testing will be at the [place] [place] location.   If you are leaving [facility] travel [place] on [place], turn left onto [address], turn night onto [address] at the 1st stop light turn right, pass the [employer] on your right and proceed to [place] (white building).

## 2019-10-13 NOTE — Progress Notes (Addendum)
10/14/2019 Caroline Bolton [id number] [birth year]   CHIEF COMPLAINT: vomiting x 2 weeks  and rectal bleeding   HISTORY OF PRESENT ILLNESS:  Caroline Bolton is a 29 year old female with a past medical history of anxiety, depression and migraine headaches. No surgical history. She presents today for further evaluation regarding vomiting and rectal bleeding. Approximately three weeks ago, she began burping, she vomited partially digested ramen noodles which she ate 3 to 4 hours earlier that day. She continued to vomit most evenings for the next 2 weeks, didn't matter what foods she ate. She continued to burp frequently. No further vomiting since 10/08/2019. During this time, she also noticed a change in her bowel pattern. She would go 4 to 5 days without passing a BM then then the next day she would pass a watery diarrhea stool. She has chronic constipation and she started taking Linzess 133mcg once daily two months ago. One week ago, she passed a soft BM with bright red blood on the toilet tissue while at work. At lunch time, she went to the bathroom and she saw blood on her undergarment with a small pinkish blood clot. She has seen bright red blood on the toilet tissue a few more times since then, last occurred on 3/09. She is passing 2 to 3 soft to loose stools daily. No recent antibiotics. No abdominal pain. No fever, sweats or chills. No family history of IBD. Maternal uncle with colon cancer.    Past Medical History:  Diagnosis Date  . Anxiety   . Cyclical vomiting 7/61/6073  . Depression   . Environmental allergies   . Migraines     History reviewed. No pertinent surgical history.  reports that she is a non-smoker but has been exposed to tobacco smoke. She has never used smokeless tobacco. She reports previous drug use. Drug: Marijuana. She reports that she does not drink alcohol. 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  family history includes Asthma in her sister; Colon cancer in her  maternal uncle; Colon polyps in her mother; Depression in her father; Diabetes in her maternal aunt; Hypertension in her mother. Allergies  Allergen Reactions  . Sulfa Antibiotics Anaphylaxis, Hives and Swelling  . Pollen Extract       Outpatient Encounter Medications as of 10/13/2019  Medication Sig  . clonazePAM (KLONOPIN) 1 MG tablet Take 1 mg by mouth 3 (three) times daily as needed.   Eduard Roux (AIMOVIG) 70 MG/ML SOAJ Inject 70 mg into the skin every 30 (thirty) days.  . hydrOXYzine (VISTARIL) 25 MG capsule Take 25 mg by mouth 2 (two) times daily.  . Levonorgestrel-Ethinyl Estradiol ([name]) 0.15-0.03 &0.01 MG tablet Take 1 tablet by mouth daily.  Marland Kitchen linaclotide (LINZESS) 145 MCG CAPS capsule Take 1 capsule (145 mcg total) by mouth daily before breakfast.  . Loratadine (CLARITIN PO) Take by mouth.  . QUEtiapine (SEROQUEL) 200 MG tablet Take 200 mg by mouth at bedtime.  . SUMAtriptan (IMITREX) 100 MG tablet Take 1 tablet earliest onset of migraine.  May repeat in 2 hours if headache persists or recurs.  Maximum 2 tablets in 24 hours  . VYVANSE 20 MG capsule TAKE 1 CAPSULE BY MOUTH ONCE DAILY AT NOON  . VYVANSE 50 MG capsule TAKE 1 CAPSULE BY MOUTH ONCE DAILY IN THE MORNING  . zolpidem (AMBIEN CR) 12.5 MG CR tablet Take 1 tablet by mouth at bedtime as needed.   . AMBULATORY NON FORMULARY MEDICATION Medication Name: Diltiazem 2% gel  Apply a pea sized amount internally three times daily. Dispense 30 GM zero refill  . [DISCONTINUED] AMITIZA 24 MCG capsule Take 24 mcg by mouth 2 (two) times daily.  . [DISCONTINUED] ARIPiprazole (ABILIFY) 2 MG tablet Take 2 mg by mouth at bedtime.  . [DISCONTINUED] ARIPiprazole (ABILIFY) 5 MG tablet Take 5 mg by mouth at bedtime.  . [DISCONTINUED] Fexofenadine HCl (ALLEGRA PO) Take by mouth.  . [DISCONTINUED] lamoTRIgine (LAMICTAL) 25 MG tablet Take 25 mg by mouth daily.   No facility-administered encounter medications on file as of 10/13/2019.     REVIEW OF SYSTEMS: All other systems reviewed and negative except where noted in the History of Present Illness.   PHYSICAL EXAM: BP 112/74   Pulse 68   Temp 97.8 F (36.6 C)   Ht 5\' 8"  (1.727 m)   Wt 215 lb (97.5 kg)   LMP 08/18/2019 (Approximate)   SpO2 99%   BMI 32.69 kg/m  General: Well developed  29 year old female in no acute distress. Head: Normocephalic and atraumatic. Eyes:  Sclerae non-icteric, conjunctive pink. Ears: Normal auditory acuity. Mouth: Dentition intact. No ulcers or lesions.  Neck: Supple, no lymphadenopathy or thyromegaly.  Lungs: Clear bilaterally to auscultation without wheezes, crackles or rhonchi. Heart: Regular rate and rhythm. No murmur, rub or gallop appreciated.  Abdomen: Soft, nontender, non distended. No masses. No hepatosplenomegaly. Normoactive bowel sounds x 4 quadrants.  Rectal:  Anterior external hemorrhoid. Small posterior fissure. No stool or blood. Jan CMA present during exam.  Musculoskeletal: Symmetrical with no gross deformities. Skin: Warm and dry. No rash or lesions on visible extremities. Extremities: No edema. Neurological: Alert oriented x 4, no focal deficits.  Psychological:  Alert and cooperative. Normal mood and affect.  ASSESSMENT AND PLAN:  1. Vomiting episodes possible post infectious process. Note EMR documents past history of bulimia which I did not discussed at the time of her office visit.  -Abdominal sonogram  -EGD benefits and risks discussed including risk with sedation, risk of bleeding, perforation and infection  -Patient to call our office if vomiting recurs   2. Chronic constipation with diarrhea on Linzess. -Hold Linzess for now -Phillip's Bacteria probiotic once daily -CBC, CMP, CRP, tTG, IgA, TSh  3. Rectal bleeding, external hemorrhoids and anal fissure. Family history of colon cancer. -Diltiazem 2% fissure cream apply a small amount inside the anus tid x 6 weeks -Colonoscopy benefits and risks  discussed including risk with sedation, risk of bleeding, perforation and infection         CC:  Henson, Vickie L, NP-C   Patient canceled procedures due to cost (deductible liability) We have left her a message to schedule a follow-up visit with me.  26, MD, Iva Boop

## 2019-10-14 DIAGNOSIS — K625 Hemorrhage of anus and rectum: Secondary | ICD-10-CM | POA: Insufficient documentation

## 2019-10-14 LAB — TISSUE TRANSGLUTAMINASE, IGA: (tTG) Ab, IgA: 1 U/mL

## 2019-10-15 ENCOUNTER — Ambulatory Visit (HOSPITAL_COMMUNITY): Payer: BC Managed Care – PPO

## 2019-10-20 ENCOUNTER — Encounter (HOSPITAL_COMMUNITY): Payer: Self-pay

## 2019-10-20 ENCOUNTER — Other Ambulatory Visit: Payer: Self-pay

## 2019-10-20 ENCOUNTER — Ambulatory Visit (HOSPITAL_COMMUNITY)
Admission: RE | Admit: 2019-10-20 | Discharge: 2019-10-20 | Disposition: A | Discharge status: Home / Self Care | Payer: BC Managed Care – PPO | Source: Ambulatory Visit | Attending: Nurse Practitioner | Admitting: Nurse Practitioner

## 2019-10-20 DIAGNOSIS — R112 Nausea with vomiting, unspecified: Secondary | ICD-10-CM

## 2019-10-21 ENCOUNTER — Ambulatory Visit (INDEPENDENT_AMBULATORY_CARE_PROVIDER_SITE_OTHER): Payer: BC Managed Care – PPO

## 2019-10-21 ENCOUNTER — Other Ambulatory Visit: Payer: Self-pay | Admitting: Internal Medicine

## 2019-10-21 DIAGNOSIS — Z1159 Encounter for screening for other viral diseases: Secondary | ICD-10-CM

## 2019-10-22 ENCOUNTER — Encounter: Payer: Self-pay | Admitting: Internal Medicine

## 2019-10-22 ENCOUNTER — Telehealth: Payer: Self-pay | Admitting: Internal Medicine

## 2019-10-22 LAB — SARS CORONAVIRUS 2 (TAT 6-24 HRS): SARS Coronavirus 2: NEGATIVE

## 2019-10-22 NOTE — Telephone Encounter (Signed)
Hi Dr. Leone Payor, this pt just cancelled her procedure that was scheduled with you on Monday 3/22 because she just received the pre-cert letter communicating her benefits. She stated that unfortunately she cannot afford it at this moment. She will call back to reschedule. Thank you.

## 2019-10-24 NOTE — Telephone Encounter (Signed)
OK  Please tell her I understand and ask that she schedule a follow-up for later in April or early May

## 2019-10-25 ENCOUNTER — Encounter: Payer: BC Managed Care – PPO | Admitting: Internal Medicine

## 2019-10-26 NOTE — Telephone Encounter (Signed)
Left message to call back to schedule f/u ov with Dr. Leone Payor.

## 2019-10-30 ENCOUNTER — Ambulatory Visit: Payer: BC Managed Care – PPO | Attending: Internal Medicine

## 2019-10-30 DIAGNOSIS — Z23 Encounter for immunization: Secondary | ICD-10-CM

## 2019-10-30 NOTE — Progress Notes (Signed)
   Covid-19 Vaccination Clinic  Name:  Caroline Bolton    MRN: [id number] DOB: 04/01/1991  10/30/2019  [name] was observed post Covid-19 immunization for 15 minutes without incident. She was provided with Vaccine Information Sheet and instruction to access the V-Safe system.   [name] was instructed to call 911 with any severe reactions post vaccine: Marland Kitchen Difficulty breathing  . Swelling of face and throat  . A fast heartbeat  . A bad rash all over body  . Dizziness and weakness   Immunizations Administered    Name Date Dose VIS Date Route   Moderna COVID-19 Vaccine 10/30/2019  9:19 AM 0.5 mL 07/06/2019 Intramuscular   Manufacturer: Moderna   Lot: [id number]   NDC: 24818-590-93

## 2019-11-02 ENCOUNTER — Ambulatory Visit: Payer: Self-pay

## 2019-11-08 ENCOUNTER — Encounter: Payer: Self-pay | Admitting: *Deleted

## 2019-11-08 NOTE — Progress Notes (Addendum)
Caroline Bolton (KeyRoderick Pee) Rx #: [id number] Aimovig 70MG /ML auto-injectors   Form [employer] Electronic PA Form [phone] NCPDP) Created 4 days ago Sent to Plan 1 hour ago Plan Response 1 hour ago Submit Clinical Questions 1 hour ago Determination Favorable 42 minutes ago Message from ([phone] Your PA request has been approved. Additional information will be provided in the approval communication. (Message 1145)  The following fax was received and sent to scan into her chart Notice of Approval Date: 11/08/2019 ADAM JAFFE [address] [place], [place] Plan Member Name: Caroline Bolton Plan Member ID: [id number] Prescriber Name: ADAM JAFFE Prescriber Phone: [phone] Prescriber Fax: [phone] Dear [name]: [facility] [employer]  received a request from your provider for coverage of Aimovig 70MG /ML [place] SOAJ. As long as you remain covered by the [facility] and there are no changes to your plan benefits, this request is approved for the following time period: 11/08/2019 - 11/07/2020 Approvals may be subject to dosing limits in accordance with FDA approved labeling, evidence-based practice guidelines or your prescription drug plan benefits. If you have not already done so, you may ask your pharmacist to fill the prescription. If you have questions, please call Customer Care toll-free at the number on your [facility] Plan ID card toll-free at [phone]. Sincerely, [facility] [employer] cc: Dr. [facility] JAFFE PA# [facility] Plan [phone] Non-Grandfathered [phone] LG

## 2019-11-23 NOTE — Progress Notes (Signed)
Virtual Visit via Video Note The purpose of this virtual visit is to provide medical care while limiting exposure to the novel coronavirus.    Consent was obtained for video visit:  Yes.   Answered questions that patient had about telehealth interaction:  Yes.   I discussed the limitations, risks, security and privacy concerns of performing an evaluation and management service by telemedicine. I also discussed with the patient that there may be a patient responsible charge related to this service. The patient expressed understanding and agreed to proceed.  Pt location: Home Physician Location: office Name of referring provider:  Avanell Shackleton, NP-C I connected with Rito Ehrlich at patients initiation/request on 11/24/2019 at  8:30 AM EDT by video enabled telemedicine application and verified that I am speaking with the correct person using two identifiers. Pt MRN:  [id number] Pt DOB:  04/14/1991 Video Participants:  Rito Ehrlich   History of Present Illness:  Caroline Bolton is a 29 year old female who follows up for migraines.  UPDATE: Started Aimovig for preventative and sumatriptan for abortive therapy in December. In February, she had about 2 and respond to sumatriptan in about 4 hours (not repeat it).  In end of March and beginning of April, she had cluster of severe headaches for 3 to 4 days.  They would last for several hours each.  Increased anxiety lately causing neck and shoulder pain which may be triggering migraines.    Rescue therapy:  Rizatriptan, then repeat with Coke; sometimes ibuprofen/Tylenol/Coke helps Current NSAIDS:  none Current analgesics:  Tylenol; Excedrin Migraine Current triptans:  sumatriptan 100mg  Current ergotamine:  none Current anti-emetic:  none Current muscle relaxants:  none Current anti-anxiolytic:  clonazepam Current sleep aide:  Ambien Current Antihypertensive medications:  none Current Antidepressant/mood stabilizing medications:  Vyvanse;  Abilify Current Anticonvulsant medications:  none Current anti-CGRP:  Aimovig 70mg  Current Vitamins/Herbal/Supplements:  none Current Antihistamines/Decongestants:  Allegra Other therapy:  none Hormone/birth control:  [name]  Caffeine:  16 oz coffee Monday through Friday; Coke only with migraines Alcohol:  rarely Diet:  200 mL water daily; tries not to skip meals.  May skip lunch Exercise:  Not routine Depression:  yes; Anxiety:  Increased anxiety Other pain:  none Sleep hygiene:  Usually good.  HISTORY: Onset: since high school.  Controlled on lamotrigine but increased after discontinuing it 4 months ago due to interfering with her birth control Location:  Usually front/behind eyes, sometimes middle Quality:  Usually throbbing, sometimes pounding Intensity:  Severe.  She denies new headache, thunderclap headache or severe headache that wakes her from sleep. Aura:  Fragmented light (only occurs when exposed to bright light) Premonitory Phase:  Dull headache, neck tension Postdrome:  Photosensitivity  Associated symptoms:  Photophobia, phonophobia, nausea.  She denies associated vomiting, unilateral numbness or weakness. Duration:  Several hours.  Needs to repeat rizatriptan. Frequency:  3 to 4 times a week. Frequency of abortive medication: 3 to 4 times a week Triggers:  Chocolate or other sweets; dehydration; skipped meals; anxiety Relieving factors:  none Activity:  aggravated   Past NSAIDS:  Ibuprofen, naproxen Past analgesics:  none Past abortive triptans:  rizatriptan 10mg  Past abortive ergotamine:  none Past muscle relaxants:  none Past anti-emetic:  none Past antihypertensive medications:  none Past antidepressant medications:  Prozac; trazodone Past anticonvulsant medications:  Lamotrigine 50mg  daily (effective but discontinued due to interference with her birth control); topiramate 50mg  twice daily; Depakote 250mg  twice daily Past anti-CGRP:  none Past  vitamins/Herbal/Supplements:  C; E Past antihistamines/decongestants:  none Other past therapies:  meditation   Family history of headache:  Mom (used to have migraines)   Past Medical History: Past Medical History:  Diagnosis Date  . Anxiety   . Cyclical vomiting 09/29/2019  . Depression   . Environmental allergies   . Migraines     Medications: Outpatient Encounter Medications as of 11/24/2019  Medication Sig  . AMBULATORY NON FORMULARY MEDICATION Medication Name: Diltiazem 2% gel Apply a pea sized amount internally three times daily. Dispense 30 GM zero refill  . clonazePAM (KLONOPIN) 1 MG tablet Take 1 mg by mouth 3 (three) times daily as needed.   Dorise Hiss (AIMOVIG) 70 MG/ML SOAJ Inject 70 mg into the skin every 30 (thirty) days.  . hydrOXYzine (VISTARIL) 25 MG capsule Take 25 mg by mouth 2 (two) times daily.  . Levonorgestrel-Ethinyl Estradiol ([name]) 0.15-0.03 &0.01 MG tablet Take 1 tablet by mouth daily.  Marland Kitchen linaclotide (LINZESS) 145 MCG CAPS capsule Take 1 capsule (145 mcg total) by mouth daily before breakfast.  . Loratadine (CLARITIN PO) Take by mouth.  . QUEtiapine (SEROQUEL) 200 MG tablet Take 200 mg by mouth at bedtime.  . SUMAtriptan (IMITREX) 100 MG tablet Take 1 tablet earliest onset of migraine.  May repeat in 2 hours if headache persists or recurs.  Maximum 2 tablets in 24 hours  . VYVANSE 20 MG capsule TAKE 1 CAPSULE BY MOUTH ONCE DAILY AT NOON  . VYVANSE 50 MG capsule TAKE 1 CAPSULE BY MOUTH ONCE DAILY IN THE MORNING  . zolpidem (AMBIEN CR) 12.5 MG CR tablet Take 1 tablet by mouth at bedtime as needed.    No facility-administered encounter medications on file as of 11/24/2019.    Allergies: Allergies  Allergen Reactions  . Sulfa Antibiotics Anaphylaxis, Hives and Swelling  . Pollen Extract     Family History: Family History  Problem Relation Age of Onset  . Hypertension Mother   . Colon polyps Mother   . Depression Father   . Asthma Sister    . Colon cancer Maternal Uncle   . Diabetes Maternal Aunt     Social History: Social History   Socioeconomic History  . Marital status: Significant Other    Spouse name: Not on file  . Number of children: Not on file  . Years of education: Not on file  . Highest education level: Not on file  Occupational History  . Not on file  Tobacco Use  . Smoking status: Passive Smoke Exposure - Never Smoker  . Smokeless tobacco: Never Used  . Tobacco comment: father but no longer lives with him   Substance and Sexual Activity  . Alcohol use: No  . Drug use: Not Currently    Types: Marijuana    Comment: THC daily but stopped 2 months ago   . Sexual activity: Yes    Birth control/protection: Pill  Other Topics Concern  . Not on file  Social History Narrative   Right handed   Drinks 16oz coffee per day   Two story home   Social Determinants of Health   Financial Resource Strain:   . Difficulty of Paying Living Expenses:   Food Insecurity:   . Worried About Programme researcher, broadcasting/film/video in the Last Year:   . Barista in the Last Year:   Transportation Needs:   . Freight forwarder (Medical):   Marland Kitchen Lack of Transportation (Non-Medical):   Physical Activity:   . Days of Exercise  per Week:   . Minutes of Exercise per Session:   Stress:   . Feeling of Stress :   Social Connections:   . Frequency of Communication with Friends and Family:   . Frequency of Social Gatherings with Friends and Family:   . Attends Religious Services:   . Active Member of Clubs or Organizations:   . Attends Archivist Meetings:   Marland Kitchen Marital Status:   Intimate Partner Violence:   . Fear of Current or Ex-Partner:   . Emotionally Abused:   Marland Kitchen Physically Abused:   . Sexually Abused:     Observations/Objective:   There were no vitals taken for this visit. No acute distress.  Alert and oriented.  Speech fluent and not dysarthric.  Language intact.  Eyes orthophoric on primary gaze.  Face  symmetric.  Assessment and Plan:   1.  Migraine without aura (sometimes with aura), without status migrainosus, not intractable. Overall, much improved on Aimovig.  She had a cluster of migraines 3 weeks ago for 3 to 4 days, likely related to increased anxiety. 2  1.  For preventative management, Aimovig 70mg  monthly 2.  For abortive therapy, sumatriptan 100mg .  Instructed her that she may repeat dose in 2 hours if needed. 3. At onset of muscle tension in neck and shoulders, she may take cyclobenzaprine 10mg  to try and prevent onset of migraine.  Cautioned her about drowsiness and should first try it at home when she knows she does not need to drive. 4.  Limit use of pain relievers to no more than 2 days out of week to prevent risk of rebound or medication-overuse headache. 5.  Keep headache diary 6.  Exercise, hydration, caffeine cessation, sleep hygiene, monitor for and avoid triggers 7. Follow up 4 months.   Follow Up Instructions:    -I discussed the assessment and treatment plan with the patient. The patient was provided an opportunity to ask questions and all were answered. The patient agreed with the plan and demonstrated an understanding of the instructions.   The patient was advised to call back or seek an in-person evaluation if the symptoms worsen or if the condition fails to improve as anticipated.     Dudley Major, DO

## 2019-11-24 ENCOUNTER — Telehealth (INDEPENDENT_AMBULATORY_CARE_PROVIDER_SITE_OTHER): Payer: BC Managed Care – PPO | Admitting: Neurology

## 2019-11-24 ENCOUNTER — Other Ambulatory Visit: Payer: Self-pay

## 2019-11-24 DIAGNOSIS — G43109 Migraine with aura, not intractable, without status migrainosus: Secondary | ICD-10-CM

## 2019-11-24 DIAGNOSIS — G43009 Migraine without aura, not intractable, without status migrainosus: Secondary | ICD-10-CM

## 2019-11-24 MED ORDER — CYCLOBENZAPRINE HCL 10 MG PO TABS: 10.0000 mg | ORAL_TABLET | Freq: Three times a day (TID) | ORAL | 3 refills | Status: AC | PRN

## 2020-01-26 ENCOUNTER — Telehealth: Payer: Self-pay

## 2020-01-26 MED ORDER — LINACLOTIDE 145 MCG PO CAPS: 145.0000 ug | ORAL_CAPSULE | Freq: Every day | ORAL | 0 refills | Status: AC

## 2020-01-26 NOTE — Telephone Encounter (Signed)
Pt. Called LM stating that she needs a 3 month supply of her Linzess called in instead 1 month because it is the same price her her pt. Last apt was 09/23/19.

## 2020-01-26 NOTE — Telephone Encounter (Signed)
This should be managed by her GI

## 2020-01-26 NOTE — Telephone Encounter (Signed)
Pt states she only seen GI once and was suppose to have colonoscopy but couldn't afford it so she hasn't planned on going back until she can afford it.   Per vickie ok to refill for 3 months

## 2020-01-26 NOTE — Telephone Encounter (Signed)
Sent in med

## 2020-04-03 NOTE — Progress Notes (Deleted)
NEUROLOGY FOLLOW UP OFFICE NOTE  Caroline Bolton [id number]  HISTORY OF PRESENT ILLNESS: Caroline Bolton is a 29 year old female who follows up for migraine.  UPDATE: Started Flexeril to treat muscle tension in neck and shoulders. ***  Rescue therapy: Rizatriptan, then repeat with Coke; sometimes ibuprofen/Tylenol/Coke helps Current NSAIDS:none Current analgesics:Tylenol; Excedrin Migraine Current triptans:sumatriptan 100mg  Current ergotamine:none Current anti-emetic:none Current muscle relaxants:cyclobenzaprine Current anti-anxiolytic:clonazepam Current sleep aide:Ambien Current Antihypertensive medications:none Current Antidepressant/mood stabilizingmedications: Vyvanse; Abilify Current Anticonvulsant medications:none Current anti-CGRP:Aimovig 70mg  Current Vitamins/Herbal/Supplements:none Current Antihistamines/Decongestants:Allegra Other therapy:none Hormone/birth control:[name]  Caffeine:16 oz coffee Monday through Friday; Coke only with migraines Alcohol:rarely Diet:200 mL water daily; tries not to skip meals. May skip lunch Exercise:Not routine Depression:yes; Anxiety:Increased anxiety Other pain:none Sleep hygiene:Usually good.  HISTORY: Onset:since high school. Controlled on lamotrigine but increased after discontinuing it 4 months ago due to interfering with her birth control Location:Usually front/behind eyes, sometimes middle Quality:Usually throbbing, sometimes pounding Intensity:Severe.Shedenies new headache, thunderclap headache or severe headache that wakes herfrom sleep. Aura:Fragmented light (only occurs when exposed to bright light) Premonitory Phase:Dull headache, neck tension Postdrome:Photosensitivity Associated symptoms:Photophobia, phonophobia, nausea.Shedenies associated vomiting,unilateral numbness or weakness. Duration:Several hours. Needs to repeat  rizatriptan. Frequency:3 to 4 times a week. Frequency of abortive medication:3 to 4 times a week Triggers:Chocolate or other sweets; dehydration; skipped meals; anxiety Relieving factors:none Activity:aggravated   Past NSAIDS:Ibuprofen, naproxen Past analgesics:none Past abortive triptans:rizatriptan 10mg  Past abortive ergotamine:none Past muscle relaxants:none Past anti-emetic:none Past antihypertensive medications:none Past antidepressant medications:Prozac; trazodone Past anticonvulsant medications:Lamotrigine 50mg  daily (effective but discontinued due to interference with her birth control); topiramate 50mg  twice daily; Depakote 250mg  twice daily Past anti-CGRP:none Past vitamins/Herbal/Supplements:C; E Past antihistamines/decongestants:none Other past therapies:meditation   Family history of headache:Mom (used to have migraines)  PAST MEDICAL HISTORY: Past Medical History:  Diagnosis Date  . Anxiety   . Cyclical vomiting 09/29/2019  . Depression   . Environmental allergies   . Migraines     MEDICATIONS: Current Outpatient Medications on File Prior to Visit  Medication Sig Dispense Refill  . AMBULATORY NON FORMULARY MEDICATION Medication Name: Diltiazem 2% gel Apply a pea sized amount internally three times daily. Dispense 30 GM zero refill 30 g 0  . clonazePAM (KLONOPIN) 1 MG tablet Take 1 mg by mouth 3 (three) times daily as needed.     . cyclobenzaprine (FLEXERIL) 10 MG tablet Take 1 tablet (10 mg total) by mouth 3 (three) times daily as needed for muscle spasms. 30 tablet 3  . Erenumab-aooe (AIMOVIG) 70 MG/ML SOAJ Inject 70 mg into the skin every 30 (thirty) days. 1 pen 11  . hydrOXYzine (VISTARIL) 25 MG capsule Take 25 mg by mouth 2 (two) times daily.    . Levonorgestrel-Ethinyl Estradiol ([name]) 0.15-0.03 &0.01 MG tablet Take 1 tablet by mouth daily.    linaclotide (LINZESS) 145 MCG CAPS capsule Take 1 capsule (145 mcg  total) by mouth daily before breakfast. 90 capsule 0  . Loratadine (CLARITIN PO) Take by mouth.    . QUEtiapine (SEROQUEL) 200 MG tablet Take 200 mg by mouth at bedtime.    . SUMAtriptan (IMITREX) 100 MG tablet Take 1 tablet earliest onset of migraine.  May repeat in 2 hours if headache persists or recurs.  Maximum 2 tablets in 24 hours 10 tablet 3  . VYVANSE 20 MG capsule TAKE 1 CAPSULE BY MOUTH ONCE DAILY AT NOON    . VYVANSE 50 MG capsule TAKE 1 CAPSULE BY MOUTH ONCE DAILY IN THE MORNING    .  zolpidem (AMBIEN CR) 12.5 MG CR tablet Take 1 tablet by mouth at bedtime as needed.      No current facility-administered medications on file prior to visit.    ALLERGIES: Allergies  Allergen Reactions  . Sulfa Antibiotics Anaphylaxis, Hives and Swelling  . Pollen Extract     FAMILY HISTORY: Family History  Problem Relation Age of Onset  . Hypertension Mother   . Colon polyps Mother   . Depression Father   . Asthma Sister   . Colon cancer Maternal Uncle   . Diabetes Maternal Aunt    SOCIAL HISTORY: Social History   Socioeconomic History  . Marital status: Significant Other    Spouse name: Not on file  . Number of children: Not on file  . Years of education: Not on file  . Highest education level: Not on file  Occupational History  . Not on file  Tobacco Use  . Smoking status: Passive Smoke Exposure - Never Smoker  . Smokeless tobacco: Never Used  . Tobacco comment: father but no longer lives with him   Vaping Use  . Vaping Use: Every day  Substance and Sexual Activity  . Alcohol use: No  . Drug use: Not Currently    Types: Marijuana    Comment: THC daily but stopped 2 months ago   . Sexual activity: Yes    Birth control/protection: Pill  Other Topics Concern  . Not on file  Social History Narrative   Right handed   Drinks 16oz coffee per day   Two story home   Social Determinants of Health   Financial Resource Strain:   . Difficulty of Paying Living Expenses: Not  on file  Food Insecurity:   . Worried About Programme researcher, broadcasting/film/video in the Last Year: Not on file  . Ran Out of Food in the Last Year: Not on file  Transportation Needs:   . Lack of Transportation (Medical): Not on file  . Lack of Transportation (Non-Medical): Not on file  Physical Activity:   . Days of Exercise per Week: Not on file  . Minutes of Exercise per Session: Not on file  Stress:   . Feeling of Stress : Not on file  Social Connections:   . Frequency of Communication with Friends and Family: Not on file  . Frequency of Social Gatherings with Friends and Family: Not on file  . Attends Religious Services: Not on file  . Active Member of Clubs or Organizations: Not on file  . Attends Banker Meetings: Not on file  . Marital Status: Not on file  Intimate Partner Violence:   . Fear of Current or Ex-Partner: Not on file  . Emotionally Abused: Not on file  . Physically Abused: Not on file  . Sexually Abused: Not on file    PHYSICAL EXAM: *** General: No acute distress.  Patient appears well-groomed.   Head:  Normocephalic/atraumatic Eyes:  Fundi examined but not visualized Neck: supple, no paraspinal tenderness, full range of motion Heart:  Regular rate and rhythm Lungs:  Clear to auscultation bilaterally Back: No paraspinal tenderness Neurological Exam: alert and oriented to person, place, and time. Attention span and concentration intact, recent and remote memory intact, fund of knowledge intact.  Speech fluent and not dysarthric, language intact.  CN II-XII intact. Bulk and tone normal, muscle strength 5/5 throughout.  Sensation to light touch, temperature and vibration intact.  Deep tendon reflexes 2+ throughout, toes downgoing.  Finger to nose and heel to  shin testing intact.  Gait normal, Romberg negative.  IMPRESSION: Migraine without and with aura, without status migrainosus, not intractable  PLAN: 1.  For preventative management, Aimovig 70mg  monthly 2.   For abortive therapy, sumatriptan 100mg   3.  For myofascial neck and shoulder pain, Flexeril 10mg  4.  Limit use of pain relievers to no more than 2 days out of week to prevent risk of rebound or medication-overuse headache. 5.  Keep headache diary 6.  Exercise, hydration, caffeine cessation, sleep hygiene, monitor for and avoid triggers 7.  Follow up ***   , DO  CC: , NP-C

## 2020-04-04 ENCOUNTER — Ambulatory Visit: Payer: BC Managed Care – PPO | Admitting: Neurology

## 2020-04-13 ENCOUNTER — Other Ambulatory Visit: Payer: Self-pay | Admitting: Neurology

## 2020-06-06 ENCOUNTER — Telehealth: Payer: Self-pay

## 2020-06-06 MED ORDER — LEVONORGEST-ETH ESTRAD 91-DAY 0.15-0.03 &0.01 MG PO TABS: 1.0000 | ORAL_TABLET | Freq: Every day | ORAL | 0 refills | Status: AC

## 2020-06-06 NOTE — Telephone Encounter (Signed)
Ok to give her 2 months

## 2020-06-06 NOTE — Telephone Encounter (Signed)
Called and advised pt that they are due for an cpe and she states she does not have insurance and trying to work on what she is going to do. Can she get a couple months worth?

## 2020-06-06 NOTE — Addendum Note (Signed)
Addended by: Herminio Commons A on: 06/06/2020 01:39 PM   Modules accepted: Orders

## 2020-06-06 NOTE — Telephone Encounter (Signed)
Gave 60 pills

## 2020-06-06 NOTE — Telephone Encounter (Signed)
Pt. Called LM stating she needs a refill on her [facility] medicine but her last prescription for that was filled by her old doctor that's why she called her for the refill. Pt. Last apt was 09/23/19.

## 2020-11-08 ENCOUNTER — Encounter: Payer: Self-pay | Admitting: Neurology

## 2020-11-08 NOTE — Progress Notes (Signed)
Prior Approval #:[id number] [place] Type:PHARMACY Recipient:Caroline Bolton Recipient QQ:[id number] Requesting Provider Name:ADAM Gus Rankin Requesting Provider IZ:[id number] Submission Date:11/08/2020 Status:APPROVED Effective Begin Date:11/08/2020 Effective End Date:11/03/2021 Payer: [employer] Status:APPROVED Drug Name/Code:AIMOVIG

## 2022-06-19 ENCOUNTER — Encounter: Payer: Self-pay | Admitting: Internal Medicine
